# Patient Record
Sex: Male | Born: 1950 | Race: White | Hispanic: No | Marital: Married | Smoking: Never smoker
Health system: Southern US, Community
[De-identification: ages and names within clinical notes are randomized; demographics above are authoritative.]

## PROBLEM LIST (undated history)

## (undated) DIAGNOSIS — E785 Hyperlipidemia, unspecified: Secondary | ICD-10-CM

## (undated) DIAGNOSIS — I10 Essential (primary) hypertension: Secondary | ICD-10-CM

---

## 1998-05-27 ENCOUNTER — Emergency Department (HOSPITAL_COMMUNITY): Admission: EM | Admit: 1998-05-27 | Discharge: 1998-05-28 | Payer: Self-pay | Admitting: Emergency Medicine

## 1999-04-30 ENCOUNTER — Emergency Department (HOSPITAL_COMMUNITY): Admission: EM | Admit: 1999-04-30 | Discharge: 1999-04-30 | Payer: Self-pay | Admitting: Emergency Medicine

## 1999-04-30 ENCOUNTER — Encounter: Payer: Self-pay | Admitting: Emergency Medicine

## 1999-07-14 ENCOUNTER — Encounter: Payer: Self-pay | Admitting: Orthopedic Surgery

## 1999-07-14 ENCOUNTER — Ambulatory Visit (HOSPITAL_COMMUNITY): Admission: RE | Admit: 1999-07-14 | Discharge: 1999-07-14 | Payer: Self-pay | Admitting: Orthopedic Surgery

## 1999-08-15 ENCOUNTER — Ambulatory Visit (HOSPITAL_BASED_OUTPATIENT_CLINIC_OR_DEPARTMENT_OTHER): Admission: RE | Admit: 1999-08-15 | Discharge: 1999-08-16 | Payer: Self-pay | Admitting: Orthopedic Surgery

## 1999-12-29 ENCOUNTER — Encounter: Admission: RE | Admit: 1999-12-29 | Discharge: 1999-12-29 | Payer: Self-pay | Admitting: Gastroenterology

## 1999-12-29 ENCOUNTER — Encounter: Payer: Self-pay | Admitting: Gastroenterology

## 2014-05-01 ENCOUNTER — Emergency Department (HOSPITAL_COMMUNITY): Payer: Commercial Managed Care - HMO

## 2014-05-01 ENCOUNTER — Emergency Department (HOSPITAL_COMMUNITY)
Admission: EM | Admit: 2014-05-01 | Discharge: 2014-05-01 | Disposition: A | Discharge status: Home / Self Care | Payer: Commercial Managed Care - HMO | Attending: Emergency Medicine | Admitting: Emergency Medicine

## 2014-05-01 ENCOUNTER — Encounter (HOSPITAL_COMMUNITY): Payer: Self-pay | Admitting: Emergency Medicine

## 2014-05-01 DIAGNOSIS — M1990 Unspecified osteoarthritis, unspecified site: Secondary | ICD-10-CM

## 2014-05-01 DIAGNOSIS — M25531 Pain in right wrist: Secondary | ICD-10-CM

## 2014-05-01 DIAGNOSIS — M13831 Other specified arthritis, right wrist: Secondary | ICD-10-CM | POA: Insufficient documentation

## 2014-05-01 DIAGNOSIS — I10 Essential (primary) hypertension: Secondary | ICD-10-CM | POA: Diagnosis not present

## 2014-05-01 DIAGNOSIS — Z7982 Long term (current) use of aspirin: Secondary | ICD-10-CM | POA: Diagnosis not present

## 2014-05-01 DIAGNOSIS — E785 Hyperlipidemia, unspecified: Secondary | ICD-10-CM | POA: Insufficient documentation

## 2014-05-01 DIAGNOSIS — R52 Pain, unspecified: Secondary | ICD-10-CM

## 2014-05-01 DIAGNOSIS — Z79899 Other long term (current) drug therapy: Secondary | ICD-10-CM | POA: Insufficient documentation

## 2014-05-01 DIAGNOSIS — M19031 Primary osteoarthritis, right wrist: Secondary | ICD-10-CM | POA: Diagnosis not present

## 2014-05-01 HISTORY — DX: Hyperlipidemia, unspecified: E78.5

## 2014-05-01 HISTORY — DX: Essential (primary) hypertension: I10

## 2014-05-01 MED ORDER — IBUPROFEN 400 MG PO TABS: 400.0000 mg | ORAL_TABLET | Freq: Three times a day (TID) | ORAL | Status: AC | PRN

## 2014-05-01 NOTE — ED Notes (Signed)
Pt ambulatory to exam room with steady gait.  

## 2014-05-01 NOTE — Discharge Instructions (Signed)
Please call your doctor for a followup appointment within 24-48 hours. When you talk to your doctor please let them know that you were seen in the emergency department and have them acquire all of your records so that they can discuss the findings with you and formulate a treatment plan to fully care for your new and ongoing problems. Please call and set-up an appointment with your primary care provider to be seen and assessed Please be seen by hand specialist this week Please keep wrist in brace at all times Please rest, ice, elevate Please take medications as prescribed and on a full stomach  Please continue to monitor symptoms closely and if symptoms are to worsen or change (fever greater than 101, chills, sweating, nausea, vomiting, chest pain, shortness of breathe, difficulty breathing, weakness, numbness, tingling, worsening or changes to pain pattern, fall, injury, increased swelling, red streaks running down arm) please report back to the Emergency Department immediately.   Arthritis, Nonspecific Arthritis is inflammation of a joint. This usually means pain, redness, warmth or swelling are present. One or more joints may be involved. There are a number of types of arthritis. Your caregiver may not be able to tell what type of arthritis you have right away. CAUSES  The most common cause of arthritis is the wear and tear on the joint (osteoarthritis). This causes damage to the cartilage, which can break down over time. The knees, hips, back and neck are most often affected by this type of arthritis. Other types of arthritis and common causes of joint pain include:  Sprains and other injuries near the joint. Sometimes minor sprains and injuries cause pain and swelling that develop hours later.  Rheumatoid arthritis. This affects hands, feet and knees. It usually affects both sides of your body at the same time. It is often associated with chronic ailments, fever, weight loss and general  weakness.  Crystal arthritis. Gout and pseudo gout can cause occasional acute severe pain, redness and swelling in the foot, ankle, or knee.  Infectious arthritis. Bacteria can get into a joint through a break in overlying skin. This can cause infection of the joint. Bacteria and viruses can also spread through the blood and affect your joints.  Drug, infectious and allergy reactions. Sometimes joints can become mildly painful and slightly swollen with these types of illnesses. SYMPTOMS   Pain is the main symptom.  Your joint or joints can also be red, swollen and warm or hot to the touch.  You may have a fever with certain types of arthritis, or even feel overall ill.  The joint with arthritis will hurt with movement. Stiffness is present with some types of arthritis. DIAGNOSIS  Your caregiver will suspect arthritis based on your description of your symptoms and on your exam. Testing may be needed to find the type of arthritis:  Blood and sometimes urine tests.  X-ray tests and sometimes CT or MRI scans.  Removal of fluid from the joint (arthrocentesis) is done to check for bacteria, crystals or other causes. Your caregiver (or a specialist) will numb the area over the joint with a local anesthetic, and use a needle to remove joint fluid for examination. This procedure is only minimally uncomfortable.  Even with these tests, your caregiver may not be able to tell what kind of arthritis you have. Consultation with a specialist (rheumatologist) may be helpful. TREATMENT  Your caregiver will discuss with you treatment specific to your type of arthritis. If the specific type cannot be  determined, then the following general recommendations may apply. Treatment of severe joint pain includes:  Rest.  Elevation.  Anti-inflammatory medication (for example, ibuprofen) may be prescribed. Avoiding activities that cause increased pain.  Only take over-the-counter or prescription medicines for  pain and discomfort as recommended by your caregiver.  Cold packs over an inflamed joint may be used for 10 to 15 minutes every hour. Hot packs sometimes feel better, but do not use overnight. Do not use hot packs if you are diabetic without your caregiver's permission.  A cortisone shot into arthritic joints may help reduce pain and swelling.  Any acute arthritis that gets worse over the next 1 to 2 days needs to be looked at to be sure there is no joint infection. Long-term arthritis treatment involves modifying activities and lifestyle to reduce joint stress jarring. This can include weight loss. Also, exercise is needed to nourish the joint cartilage and remove waste. This helps keep the muscles around the joint strong. HOME CARE INSTRUCTIONS   Do not take aspirin to relieve pain if gout is suspected. This elevates uric acid levels.  Only take over-the-counter or prescription medicines for pain, discomfort or fever as directed by your caregiver.  Rest the joint as much as possible.  If your joint is swollen, keep it elevated.  Use crutches if the painful joint is in your leg.  Drinking plenty of fluids may help for certain types of arthritis.  Follow your caregiver's dietary instructions.  Try low-impact exercise such as:  Swimming.  Water aerobics.  Biking.  Walking.  Morning stiffness is often relieved by a warm shower.  Put your joints through regular range-of-motion. SEEK MEDICAL CARE IF:   You do not feel better in 24 hours or are getting worse.  You have side effects to medications, or are not getting better with treatment. SEEK IMMEDIATE MEDICAL CARE IF:   You have a fever.  You develop severe joint pain, swelling or redness.  Many joints are involved and become painful and swollen.  There is severe back pain and/or leg weakness.  You have loss of bowel or bladder control. Document Released: 05/03/2004 Document Revised: 06/18/2011 Document Reviewed:  05/19/2008 [facility] Patient Information 2015 [place], [place]. This information is not intended to replace advice given to you by your health care provider. Make sure you discuss any questions you have with your health care provider.

## 2014-05-01 NOTE — ED Provider Notes (Signed)
CSN: [id number]     Arrival date & time 05/01/14  1420 History   First MD Initiated Contact with Patient 05/01/14 1546     This chart was scribed for non-physician practitioner, Raymon Mutton PA-C working with Lyanne Co, MD by Arlan Organ, ED Scribe. This patient was seen in room WTR7/WTR7 and the patient's care was started at 4:33 PM.   Chief Complaint  Patient presents with  . Wrist Pain   The history is provided by the patient. No language interpreter was used.    HPI Comments: Charles Hodges is a 64 y/o M with PMHx of HTN, HLD, presenting to the ED with right wrist pain that has been ongoing for one week. Patient reported that the pain started when he woke up last Saturday morning and stated that the pain has continued since then. Patient reported the pain is a throbbing pain that is constant to his right wrist. Stated that the pain worsens with motion. Reported that he has noticed swelling and redness to the skin. Stated that he has been taking Tylenol with minimal relief. Reported that he is right hand dominant. Denied fever, chills, drainage, fall, injury, numbness, tingling, weakness, loss of sensation.  He is followed by Dr. Farris Has PCP  Past Medical History  Diagnosis Date  . Hypertension   . Hyperlipidemia    No past surgical history on file. No family history on file. History  Substance Use Topics  . Smoking status: Never Smoker   . Smokeless tobacco: Not on file  . Alcohol Use: No    Review of Systems  Constitutional: Negative for fever and chills.  Musculoskeletal: Positive for joint swelling and arthralgias.  Neurological: Negative for weakness and numbness.      Allergies  Codeine  Home Medications   Prior to Admission medications   Medication Sig Start Date End Date Taking? Authorizing Provider  acetaminophen (TYLENOL) 500 MG tablet Take 1,000 mg by mouth every 4 (four) hours as needed for moderate pain.   Yes Historical Provider, MD   aspirin EC 81 MG tablet Take 81 mg by mouth daily.   Yes Historical Provider, MD  benazepril-hydrochlorthiazide (LOTENSIN HCT) 20-25 MG per tablet Take 1 tablet by mouth daily.   Yes Historical Provider, MD  citalopram (CELEXA) 20 MG tablet Take 20 mg by mouth daily.   Yes Historical Provider, MD  labetalol (NORMODYNE) 200 MG tablet Take 200 mg by mouth daily.   Yes Historical Provider, MD  simvastatin (ZOCOR) 80 MG tablet Take 80 mg by mouth daily.   Yes Historical Provider, MD  ibuprofen (ADVIL,MOTRIN) 400 MG tablet Take 1 tablet (400 mg total) by mouth every 8 (eight) hours as needed. 05/01/14   Tiphany Fayson, PA-C   Triage Vitals: BP 107/59 mmHg  Pulse 64  Temp(Src) 97.8 F (36.6 C) (Oral)  Resp 18  SpO2 100%   Physical Exam  Constitutional: He is oriented to person, place, and time. He appears well-developed and well-nourished.  HENT:  Head: Normocephalic and atraumatic.  Eyes: Conjunctivae and EOM are normal. Pupils are equal, round, and reactive to light. Right eye exhibits no discharge. Left eye exhibits no discharge.  Neck: Normal range of motion. Neck supple.  Cardiovascular: Normal rate, regular rhythm and normal heart sounds.   Pulses:      Radial pulses are 2+ on the right side, and 2+ on the left side.  Cap refill < 3 seconds  Pulmonary/Chest: Effort normal and breath sounds normal. No respiratory  distress. He has no wheezes. He has no rales.  Musculoskeletal:       Right wrist: He exhibits decreased range of motion (secondary to pain ), tenderness and swelling. He exhibits no bony tenderness, no effusion, no crepitus and no deformity.       Arms: Swelling and erythema identified to the right wrist with warmth upon palpation. Patient is able to flex, extend, radial deviation, and ulnar deviation but limited secondary to pain. Patient able to produce a fist, but painful to do so. Full ROM to the digits of the right hand without difficulty or ataxia.   Neurological: He is  alert and oriented to person, place, and time. No cranial nerve deficit. He exhibits normal muscle tone. Coordination normal.  Cranial nerves III-XII grossly intact Strength 5+/5+ to upper extremities bilaterally with resistance applied, equal distribution noted Strength intact to MCP, PIP, DIP joints of right hand Sensation intact with differentiation to sharp and dull touch   Skin: There is erythema.  Please see MS section   Psychiatric: He has a normal mood and affect. His behavior is normal. Thought content normal.  Nursing note and vitals reviewed.   ED Course  Procedures (including critical care time)  DIAGNOSTIC STUDIES: Oxygen Saturation is 100% on RA, Normal by my interpretation.    COORDINATION OF CARE: 4:33 PM- Will order DG wrist complete R. Discussed treatment plan with pt at bedside and pt agreed to plan.     Labs Review Labs Reviewed - No data to display  Imaging Review Dg Wrist Complete Right  05/01/2014   CLINICAL DATA:  Pain and swelling right wrist for 1 week, no known injury  EXAM: RIGHT WRIST - COMPLETE 3+ VIEW  COMPARISON:  None.  FINDINGS: Four views of the right wrist submitted. No acute fracture or subluxation. There is narrowing of radiocarpal joint space. Chondrocalcinosis noted ulnar carpal joint.  IMPRESSION: No acute fracture or subluxation. Osteoarthritic changes and chondrocalcinosis as described above.   Electronically Signed   By: Natasha MeadLiviu  Pop M.D.   On: 05/01/2014 15:09     EKG Interpretation None       5:19 PM Discussed case in great detail with attending physician, Dr. Patria Maneampos. Dr. Patria Maneampos at bedside to assess patient.  As per physician, recommended patient to be placed in wrist brace, recommended anti-inflammatories and for patient to be seen by PCP.   MDM   Final diagnoses:  Right wrist pain  Arthritis    Medications - No data to display   Filed Vitals:   05/01/14 1441  BP: 107/59  Pulse: 64  Temp: 97.8 F (36.6 C)  TempSrc: Oral   Resp: 18  SpO2: 100%   I personally performed the services described in this documentation, which was scribed in my presence. The recorded information has been reviewed and is accurate.  Plain film of right wrist identified negative acute osseous injury - osteoarthritis changes and chondrocalcinosis noted.  Doubt septic joint. Doubt gout/pseudogout. Cannot rule out reactive arthritis. Negative focal neurological deficits. ROM noted to the right wrist - limited secondary to pain. Sensation intact. Pulses palpable and strong. Cap refill < 3 seconds. Patient seen and assessed by attending physician - recommended wrist brace and anti-inflammatories, recommended referral to PCP. Patient stable, afebrile. Patient not septic appearing. Discharged patient. Discussed with patient to rest and stay hydrated. Discussed with patient to avoid any physical or strenuous activity. Referred to PCP and hand specialist. Discussed with patient to closely monitor symptoms and if symptoms  are to worsen or change to report back to the ED - strict return instructions given.  Patient agreed to plan of care, understood, all questions answered.   Raymon Mutton, PA-C 05/01/14 1736  Lyanne Co, MD 05/01/14 [phone]

## 2014-05-01 NOTE — ED Notes (Signed)
Ortho tech at bedside 

## 2014-05-01 NOTE — ED Notes (Signed)
Per pt, states right wrist pain since last Sun-no injury

## 2014-05-25 DIAGNOSIS — M25531 Pain in right wrist: Secondary | ICD-10-CM | POA: Diagnosis not present

## 2014-05-25 DIAGNOSIS — M25431 Effusion, right wrist: Secondary | ICD-10-CM | POA: Diagnosis not present

## 2014-05-25 DIAGNOSIS — M25631 Stiffness of right wrist, not elsewhere classified: Secondary | ICD-10-CM | POA: Diagnosis not present

## 2014-05-25 DIAGNOSIS — M25461 Effusion, right knee: Secondary | ICD-10-CM | POA: Diagnosis not present

## 2014-05-25 DIAGNOSIS — M11831 Other specified crystal arthropathies, right wrist: Secondary | ICD-10-CM | POA: Diagnosis not present

## 2014-06-01 DIAGNOSIS — M10031 Idiopathic gout, right wrist: Secondary | ICD-10-CM | POA: Diagnosis not present

## 2014-06-01 DIAGNOSIS — M25531 Pain in right wrist: Secondary | ICD-10-CM | POA: Diagnosis not present

## 2014-06-01 DIAGNOSIS — M11831 Other specified crystal arthropathies, right wrist: Secondary | ICD-10-CM | POA: Diagnosis not present

## 2014-06-11 DIAGNOSIS — N289 Disorder of kidney and ureter, unspecified: Secondary | ICD-10-CM | POA: Diagnosis not present

## 2014-06-11 DIAGNOSIS — I10 Essential (primary) hypertension: Secondary | ICD-10-CM | POA: Diagnosis not present

## 2014-06-11 DIAGNOSIS — H6122 Impacted cerumen, left ear: Secondary | ICD-10-CM | POA: Diagnosis not present

## 2014-06-11 DIAGNOSIS — M109 Gout, unspecified: Secondary | ICD-10-CM | POA: Diagnosis not present

## 2014-06-11 DIAGNOSIS — D649 Anemia, unspecified: Secondary | ICD-10-CM | POA: Diagnosis not present

## 2014-08-05 DIAGNOSIS — M109 Gout, unspecified: Secondary | ICD-10-CM | POA: Diagnosis not present

## 2014-12-02 DIAGNOSIS — M109 Gout, unspecified: Secondary | ICD-10-CM | POA: Diagnosis not present

## 2014-12-02 DIAGNOSIS — N289 Disorder of kidney and ureter, unspecified: Secondary | ICD-10-CM | POA: Diagnosis not present

## 2014-12-02 DIAGNOSIS — D649 Anemia, unspecified: Secondary | ICD-10-CM | POA: Diagnosis not present

## 2014-12-08 DIAGNOSIS — E538 Deficiency of other specified B group vitamins: Secondary | ICD-10-CM | POA: Diagnosis not present

## 2014-12-09 DIAGNOSIS — E538 Deficiency of other specified B group vitamins: Secondary | ICD-10-CM | POA: Diagnosis not present

## 2014-12-10 DIAGNOSIS — E538 Deficiency of other specified B group vitamins: Secondary | ICD-10-CM | POA: Diagnosis not present

## 2014-12-17 DIAGNOSIS — E538 Deficiency of other specified B group vitamins: Secondary | ICD-10-CM | POA: Diagnosis not present

## 2014-12-20 DIAGNOSIS — E538 Deficiency of other specified B group vitamins: Secondary | ICD-10-CM | POA: Diagnosis not present

## 2014-12-24 DIAGNOSIS — E538 Deficiency of other specified B group vitamins: Secondary | ICD-10-CM | POA: Diagnosis not present

## 2015-01-05 DIAGNOSIS — E538 Deficiency of other specified B group vitamins: Secondary | ICD-10-CM | POA: Diagnosis not present

## 2015-01-05 DIAGNOSIS — D649 Anemia, unspecified: Secondary | ICD-10-CM | POA: Diagnosis not present

## 2015-01-14 DIAGNOSIS — E538 Deficiency of other specified B group vitamins: Secondary | ICD-10-CM | POA: Diagnosis not present

## 2015-02-18 DIAGNOSIS — F329 Major depressive disorder, single episode, unspecified: Secondary | ICD-10-CM | POA: Diagnosis not present

## 2015-02-18 DIAGNOSIS — Z23 Encounter for immunization: Secondary | ICD-10-CM | POA: Diagnosis not present

## 2015-02-18 DIAGNOSIS — D649 Anemia, unspecified: Secondary | ICD-10-CM | POA: Diagnosis not present

## 2015-02-18 DIAGNOSIS — N183 Chronic kidney disease, stage 3 (moderate): Secondary | ICD-10-CM | POA: Diagnosis not present

## 2015-02-18 DIAGNOSIS — E785 Hyperlipidemia, unspecified: Secondary | ICD-10-CM | POA: Diagnosis not present

## 2015-02-18 DIAGNOSIS — Z0001 Encounter for general adult medical examination with abnormal findings: Secondary | ICD-10-CM | POA: Diagnosis not present

## 2015-02-18 DIAGNOSIS — R7301 Impaired fasting glucose: Secondary | ICD-10-CM | POA: Diagnosis not present

## 2015-02-18 DIAGNOSIS — M109 Gout, unspecified: Secondary | ICD-10-CM | POA: Diagnosis not present

## 2015-02-22 DIAGNOSIS — E785 Hyperlipidemia, unspecified: Secondary | ICD-10-CM | POA: Diagnosis not present

## 2015-02-22 DIAGNOSIS — M109 Gout, unspecified: Secondary | ICD-10-CM | POA: Diagnosis not present

## 2015-02-22 DIAGNOSIS — E611 Iron deficiency: Secondary | ICD-10-CM | POA: Diagnosis not present

## 2015-02-22 DIAGNOSIS — Z0001 Encounter for general adult medical examination with abnormal findings: Secondary | ICD-10-CM | POA: Diagnosis not present

## 2015-02-22 DIAGNOSIS — D649 Anemia, unspecified: Secondary | ICD-10-CM | POA: Diagnosis not present

## 2015-02-22 DIAGNOSIS — E538 Deficiency of other specified B group vitamins: Secondary | ICD-10-CM | POA: Diagnosis not present

## 2015-02-22 DIAGNOSIS — Z125 Encounter for screening for malignant neoplasm of prostate: Secondary | ICD-10-CM | POA: Diagnosis not present

## 2015-02-24 DIAGNOSIS — Z1211 Encounter for screening for malignant neoplasm of colon: Secondary | ICD-10-CM | POA: Diagnosis not present

## 2016-06-20 DIAGNOSIS — I10 Essential (primary) hypertension: Secondary | ICD-10-CM | POA: Diagnosis not present

## 2016-06-20 DIAGNOSIS — Z1211 Encounter for screening for malignant neoplasm of colon: Secondary | ICD-10-CM | POA: Diagnosis not present

## 2016-06-20 DIAGNOSIS — R7309 Other abnormal glucose: Secondary | ICD-10-CM | POA: Diagnosis not present

## 2016-06-20 DIAGNOSIS — F329 Major depressive disorder, single episode, unspecified: Secondary | ICD-10-CM | POA: Diagnosis not present

## 2016-06-20 DIAGNOSIS — E785 Hyperlipidemia, unspecified: Secondary | ICD-10-CM | POA: Diagnosis not present

## 2017-06-18 DIAGNOSIS — F329 Major depressive disorder, single episode, unspecified: Secondary | ICD-10-CM | POA: Diagnosis not present

## 2017-06-18 DIAGNOSIS — D649 Anemia, unspecified: Secondary | ICD-10-CM | POA: Diagnosis not present

## 2017-06-18 DIAGNOSIS — N289 Disorder of kidney and ureter, unspecified: Secondary | ICD-10-CM | POA: Diagnosis not present

## 2017-06-18 DIAGNOSIS — E785 Hyperlipidemia, unspecified: Secondary | ICD-10-CM | POA: Diagnosis not present

## 2017-06-18 DIAGNOSIS — R7301 Impaired fasting glucose: Secondary | ICD-10-CM | POA: Diagnosis not present

## 2017-06-18 DIAGNOSIS — I10 Essential (primary) hypertension: Secondary | ICD-10-CM | POA: Diagnosis not present

## 2017-06-18 DIAGNOSIS — Z1211 Encounter for screening for malignant neoplasm of colon: Secondary | ICD-10-CM | POA: Diagnosis not present

## 2018-09-25 DIAGNOSIS — Z1211 Encounter for screening for malignant neoplasm of colon: Secondary | ICD-10-CM | POA: Diagnosis not present

## 2018-09-25 DIAGNOSIS — D649 Anemia, unspecified: Secondary | ICD-10-CM | POA: Diagnosis not present

## 2018-09-25 DIAGNOSIS — N289 Disorder of kidney and ureter, unspecified: Secondary | ICD-10-CM | POA: Diagnosis not present

## 2018-09-25 DIAGNOSIS — I10 Essential (primary) hypertension: Secondary | ICD-10-CM | POA: Diagnosis not present

## 2018-09-25 DIAGNOSIS — E785 Hyperlipidemia, unspecified: Secondary | ICD-10-CM | POA: Diagnosis not present

## 2018-09-25 DIAGNOSIS — R7301 Impaired fasting glucose: Secondary | ICD-10-CM | POA: Diagnosis not present

## 2018-09-25 DIAGNOSIS — F329 Major depressive disorder, single episode, unspecified: Secondary | ICD-10-CM | POA: Diagnosis not present

## 2018-10-13 DIAGNOSIS — Z1211 Encounter for screening for malignant neoplasm of colon: Secondary | ICD-10-CM | POA: Diagnosis not present

## 2018-10-13 DIAGNOSIS — D649 Anemia, unspecified: Secondary | ICD-10-CM | POA: Diagnosis not present

## 2018-10-13 DIAGNOSIS — R7301 Impaired fasting glucose: Secondary | ICD-10-CM | POA: Diagnosis not present

## 2018-10-13 DIAGNOSIS — E785 Hyperlipidemia, unspecified: Secondary | ICD-10-CM | POA: Diagnosis not present

## 2020-06-29 DIAGNOSIS — R195 Other fecal abnormalities: Secondary | ICD-10-CM | POA: Diagnosis not present

## 2020-06-29 DIAGNOSIS — I10 Essential (primary) hypertension: Secondary | ICD-10-CM | POA: Diagnosis not present

## 2020-06-29 DIAGNOSIS — E538 Deficiency of other specified B group vitamins: Secondary | ICD-10-CM | POA: Diagnosis not present

## 2020-06-29 DIAGNOSIS — E785 Hyperlipidemia, unspecified: Secondary | ICD-10-CM | POA: Diagnosis not present

## 2020-06-29 DIAGNOSIS — R7301 Impaired fasting glucose: Secondary | ICD-10-CM | POA: Diagnosis not present

## 2020-06-29 DIAGNOSIS — R7309 Other abnormal glucose: Secondary | ICD-10-CM | POA: Diagnosis not present

## 2020-06-29 DIAGNOSIS — F329 Major depressive disorder, single episode, unspecified: Secondary | ICD-10-CM | POA: Diagnosis not present

## 2020-06-29 DIAGNOSIS — N183 Chronic kidney disease, stage 3 unspecified: Secondary | ICD-10-CM | POA: Diagnosis not present

## 2020-06-29 DIAGNOSIS — D649 Anemia, unspecified: Secondary | ICD-10-CM | POA: Diagnosis not present

## 2020-09-02 DIAGNOSIS — E538 Deficiency of other specified B group vitamins: Secondary | ICD-10-CM | POA: Diagnosis not present

## 2020-10-07 DIAGNOSIS — R42 Dizziness and giddiness: Secondary | ICD-10-CM | POA: Diagnosis not present

## 2020-10-07 DIAGNOSIS — H6122 Impacted cerumen, left ear: Secondary | ICD-10-CM | POA: Diagnosis not present

## 2020-10-31 ENCOUNTER — Emergency Department (HOSPITAL_COMMUNITY): Payer: Medicare HMO

## 2020-10-31 ENCOUNTER — Emergency Department (HOSPITAL_COMMUNITY)
Admission: EM | Admit: 2020-10-31 | Discharge: 2020-10-31 | Disposition: A | Discharge status: Home / Self Care | Payer: Medicare HMO | Attending: Emergency Medicine | Admitting: Emergency Medicine

## 2020-10-31 ENCOUNTER — Encounter (HOSPITAL_COMMUNITY): Payer: Self-pay | Admitting: Emergency Medicine

## 2020-10-31 DIAGNOSIS — R011 Cardiac murmur, unspecified: Secondary | ICD-10-CM | POA: Insufficient documentation

## 2020-10-31 DIAGNOSIS — R42 Dizziness and giddiness: Secondary | ICD-10-CM | POA: Diagnosis not present

## 2020-10-31 DIAGNOSIS — R61 Generalized hyperhidrosis: Secondary | ICD-10-CM | POA: Diagnosis not present

## 2020-10-31 DIAGNOSIS — Y900 Blood alcohol level of less than 20 mg/100 ml: Secondary | ICD-10-CM | POA: Diagnosis not present

## 2020-10-31 DIAGNOSIS — Z7982 Long term (current) use of aspirin: Secondary | ICD-10-CM | POA: Diagnosis not present

## 2020-10-31 DIAGNOSIS — I639 Cerebral infarction, unspecified: Secondary | ICD-10-CM | POA: Diagnosis not present

## 2020-10-31 DIAGNOSIS — Z79899 Other long term (current) drug therapy: Secondary | ICD-10-CM | POA: Diagnosis not present

## 2020-10-31 DIAGNOSIS — I10 Essential (primary) hypertension: Secondary | ICD-10-CM | POA: Diagnosis not present

## 2020-10-31 DIAGNOSIS — R110 Nausea: Secondary | ICD-10-CM | POA: Diagnosis not present

## 2020-10-31 DIAGNOSIS — E860 Dehydration: Secondary | ICD-10-CM | POA: Diagnosis not present

## 2020-10-31 DIAGNOSIS — I4430 Unspecified atrioventricular block: Secondary | ICD-10-CM | POA: Diagnosis not present

## 2020-10-31 DIAGNOSIS — M542 Cervicalgia: Secondary | ICD-10-CM | POA: Insufficient documentation

## 2020-10-31 DIAGNOSIS — I440 Atrioventricular block, first degree: Secondary | ICD-10-CM | POA: Diagnosis not present

## 2020-10-31 DIAGNOSIS — R29810 Facial weakness: Secondary | ICD-10-CM | POA: Diagnosis not present

## 2020-10-31 LAB — DIFFERENTIAL
Abs Immature Granulocytes: 0.08 10*3/uL — ABNORMAL HIGH (ref 0.00–0.07)
Basophils Absolute: 0 10*3/uL (ref 0.0–0.1)
Basophils Relative: 0 %
Eosinophils Absolute: 0.1 10*3/uL (ref 0.0–0.5)
Eosinophils Relative: 1 %
Immature Granulocytes: 1 %
Lymphocytes Relative: 8 %
Lymphs Abs: 0.8 10*3/uL (ref 0.7–4.0)
Monocytes Absolute: 0.8 10*3/uL (ref 0.1–1.0)
Monocytes Relative: 8 %
Neutro Abs: 8 10*3/uL — ABNORMAL HIGH (ref 1.7–7.7)
Neutrophils Relative %: 82 %

## 2020-10-31 LAB — COMPREHENSIVE METABOLIC PANEL
ALT: 22 U/L (ref 0–44)
AST: 26 U/L (ref 15–41)
Albumin: 3.8 g/dL (ref 3.5–5.0)
Alkaline Phosphatase: 49 U/L (ref 38–126)
Anion gap: 6 (ref 5–15)
BUN: 24 mg/dL — ABNORMAL HIGH (ref 8–23)
CO2: 23 mmol/L (ref 22–32)
Calcium: 9 mg/dL (ref 8.9–10.3)
Chloride: 108 mmol/L (ref 98–111)
Creatinine, Ser: 1.44 mg/dL — ABNORMAL HIGH (ref 0.61–1.24)
GFR, Estimated: 52 mL/min — ABNORMAL LOW (ref >60)
Glucose, Bld: 178 mg/dL — ABNORMAL HIGH (ref 70–99)
Potassium: 3.9 mmol/L (ref 3.5–5.1)
Sodium: 137 mmol/L (ref 135–145)
Total Bilirubin: 0.8 mg/dL (ref 0.3–1.2)
Total Protein: 7 g/dL (ref 6.5–8.1)

## 2020-10-31 LAB — PROTIME-INR
INR: 1 (ref 0.8–1.2)
Prothrombin Time: 13.6 seconds (ref 11.4–15.2)

## 2020-10-31 LAB — URINALYSIS, ROUTINE W REFLEX MICROSCOPIC
Bilirubin Urine: NEGATIVE
Glucose, UA: 50 mg/dL — AB
Ketones, ur: NEGATIVE mg/dL
Nitrite: NEGATIVE
Protein, ur: 30 mg/dL — AB
Specific Gravity, Urine: 1.014 (ref 1.005–1.030)
WBC, UA: >50 WBC/hpf — ABNORMAL HIGH (ref 0–5)
pH: 7 (ref 5.0–8.0)

## 2020-10-31 LAB — CBC
HCT: 40.3 % (ref 39.0–52.0)
Hemoglobin: 13.7 g/dL (ref 13.0–17.0)
MCH: 32 pg (ref 26.0–34.0)
MCHC: 34 g/dL (ref 30.0–36.0)
MCV: 94.2 fL (ref 80.0–100.0)
Platelets: 187 10*3/uL (ref 150–400)
RBC: 4.28 MIL/uL (ref 4.22–5.81)
RDW: 12.3 % (ref 11.5–15.5)
WBC: 9.9 10*3/uL (ref 4.0–10.5)
nRBC: 0 % (ref 0.0–0.2)

## 2020-10-31 LAB — ETHANOL: Alcohol, Ethyl (B): <10 mg/dL (ref <10)

## 2020-10-31 LAB — I-STAT CHEM 8, ED
BUN: 29 mg/dL — ABNORMAL HIGH (ref 8–23)
Calcium, Ion: 1.13 mmol/L — ABNORMAL LOW (ref 1.15–1.40)
Chloride: 108 mmol/L (ref 98–111)
Creatinine, Ser: 1.3 mg/dL — ABNORMAL HIGH (ref 0.61–1.24)
Glucose, Bld: 173 mg/dL — ABNORMAL HIGH (ref 70–99)
HCT: 40 % (ref 39.0–52.0)
Hemoglobin: 13.6 g/dL (ref 13.0–17.0)
Potassium: 4.1 mmol/L (ref 3.5–5.1)
Sodium: 140 mmol/L (ref 135–145)
TCO2: 23 mmol/L (ref 22–32)

## 2020-10-31 LAB — APTT: aPTT: 25 seconds (ref 24–36)

## 2020-10-31 LAB — RAPID URINE DRUG SCREEN, HOSP PERFORMED
Amphetamines: NOT DETECTED
Barbiturates: NOT DETECTED
Benzodiazepines: NOT DETECTED
Cocaine: NOT DETECTED
Opiates: NOT DETECTED
Tetrahydrocannabinol: NOT DETECTED

## 2020-10-31 MED ORDER — SODIUM CHLORIDE 0.9 % IV SOLN
1.0000 g | Freq: Once | INTRAVENOUS | Status: AC
  Administered 2020-10-31: 1 g via INTRAVENOUS
  Filled 2020-10-31: qty 10

## 2020-10-31 MED ORDER — ONDANSETRON 4 MG PO TBDP
4.0000 mg | ORAL_TABLET | Freq: Once | ORAL | Status: AC
  Administered 2020-10-31: 4 mg via ORAL
  Filled 2020-10-31: qty 1

## 2020-10-31 MED ORDER — CEPHALEXIN 500 MG PO CAPS: 500.0000 mg | ORAL_CAPSULE | Freq: Three times a day (TID) | ORAL | 0 refills | Status: AC

## 2020-10-31 MED ORDER — LABETALOL HCL 5 MG/ML IV SOLN
10.0000 mg | Freq: Once | INTRAVENOUS | Status: AC
  Administered 2020-10-31: 10 mg via INTRAVENOUS
  Filled 2020-10-31: qty 4

## 2020-10-31 MED ORDER — MECLIZINE HCL 25 MG PO TABS
25.0000 mg | ORAL_TABLET | Freq: Once | ORAL | Status: AC
  Administered 2020-10-31: 25 mg via ORAL
  Filled 2020-10-31: qty 1

## 2020-10-31 MED ORDER — MECLIZINE HCL 25 MG PO TABS: 25.0000 mg | ORAL_TABLET | Freq: Three times a day (TID) | ORAL | 0 refills | Status: AC | PRN

## 2020-10-31 NOTE — Discharge Instructions (Addendum)
You were evaluated in the ER for your dizziness.  Our work-up did not reveal any significantly concerning disease.  This includes negative imaging and reassuring lab work.  Your blood pressure improved after we treated with labetalol.  You should be sure to follow-up with your primary care doctor to discuss increasing your blood pressure medication again to better control this.  Additionally, you were diagnosed with a UTI, for which you should take Keflex 3 times a day every day for the next 5 days, even if symptoms appear to start improving.  Please return to the ER for any worsening, including fevers, chills, fainting episodes, worsening dizziness, severe headaches, severe chest pain

## 2020-10-31 NOTE — ED Provider Notes (Signed)
Emergency Medicine Provider Triage Evaluation Note  Charles Hodges , a 70 y.o. male  was evaluated in triage.  Pt complains of dizziness.  Symptoms began around 9:00 this morning.  No chest pain or shortness of breath.  No weakness or numbness in the extremities. Sxs worse with standing and eye movement.   Review of Systems  Positive: dizziness Negative: numbness  Physical Exam  BP (!) 183/79 (BP Location: Right Arm)   Pulse 68   Temp 97.7 F (36.5 C) (Oral)   Resp 18   Ht 5\' 11"  (1.803 m)   Wt 72.1 kg   SpO2 97%   BMI 22.18 kg/m  Gen:   Awake, no distress   Resp:  Normal effort  MSK:   Moves extremities without difficulty.  Strength and sensation intact x4.   Other:  CN intact.  Negative pronator drift. EOMI  Medical Decision Making  Medically screening exam initiated at 10:40 AM.  Appropriate orders placed.  was informed that the remainder of the evaluation will be completed by another provider, this initial triage assessment does not replace that evaluation, and the importance of remaining in the ED until their evaluation is complete.  No focal neurologic deficits, so code stroke was not called.  However will obtain stroke work-up due to acute onset dizziness.  Also consider vertigo.   Hansel Feinstein, PA-C 10/31/20 1041    11/02/20, MD 11/01/20 1753

## 2020-10-31 NOTE — ED Triage Notes (Signed)
Pt arrives via EMS with sudden onset dizziness at 9 am while pumping gas. Endorses left sided neck pressure. EMS gave 500 NS, pt has not taken BP meds. Endorses similar event on Thursday night with nausea.

## 2020-10-31 NOTE — ED Provider Notes (Addendum)
[facility]   CSN: [id number] Arrival date & time: 10/31/20  1017     History Chief Complaint  Patient presents with   Dizziness    Charles Hodges is a 70 y.o. male.   Dizziness  70 year old male PMHx HTN, HLD, brought by EMS for dizziness.  States that onset was sudden at 0900, while pumping gas, worse with standing and eye movement.  Associated symptoms include mild left posterior neck pressure, brief diaphoresis that has since resolved.  Did not have antihypertensives today.  Had similar symptoms 4 days ago with nausea.  S/p NS 500 mL with EMS.  No further medical concern at this time including fevers, chills, sore throat, rhinorrhea, CP, palpitations, pedal edema, SOB, focal weakness/paresthesias headache, abdominal pain, bowel changes.  History obtained from patient, wife, chart review.  Past Medical History:  Diagnosis Date   Hyperlipidemia    Hypertension     There are no problems to display for this patient.   History reviewed. No pertinent surgical history.     No family history on file.  Social History   Tobacco Use   Smoking status: Never  Substance Use Topics   Alcohol use: No   Drug use: No    Home Medications Prior to Admission medications   Medication Sig Start Date End Date Taking? Authorizing Provider  cephALEXin (KEFLEX) 500 MG capsule Take 1 capsule (500 mg total) by mouth 3 (three) times daily for 5 days. 10/31/20 11/05/20 Yes Cleatis Fandrich, Harriette BouillonAdditya, MD  meclizine (ANTIVERT) 25 MG tablet Take 1 tablet (25 mg total) by mouth 3 (three) times daily as needed for dizziness. 10/31/20  Yes Luka Reisch, MD  acetaminophen (TYLENOL) 500 MG tablet Take 1,000 mg by mouth every 4 (four) hours as needed for moderate pain.    [provider]  aspirin EC 81 MG tablet Take 81 mg by mouth daily.    [provider]  benazepril-hydrochlorthiazide (LOTENSIN HCT) 20-25 MG per tablet  Take 1 tablet by mouth daily.    [provider]  citalopram (CELEXA) 20 MG tablet Take 20 mg by mouth daily.    [provider]  ibuprofen (ADVIL,MOTRIN) 400 MG tablet Take 1 tablet (400 mg total) by mouth every 8 (eight) hours as needed. 05/01/14   Sciacca, Marissa, PA-C  labetalol (NORMODYNE) 200 MG tablet Take 200 mg by mouth daily.    [provider]  simvastatin (ZOCOR) 80 MG tablet Take 80 mg by mouth daily.    [provider]    Allergies    Codeine  Review of Systems   Review of Systems  Neurological:  Positive for dizziness.  All other systems reviewed and are negative.  Physical Exam Updated Vital Signs BP (!) 152/95   Pulse 82   Temp 98.7 F (37.1 C) (Oral)   Resp 14   Ht 5\' 11"  (1.803 m)   Wt 72.1 kg   SpO2 96%   BMI 22.18 kg/m   Physical Exam Vitals and nursing note reviewed.  Constitutional:      General: He is not in acute distress. HENT:     Head: Normocephalic and atraumatic.     Mouth/Throat:     Mouth: Mucous membranes are moist.     Pharynx: Oropharynx is clear.  Eyes:     Extraocular Movements: Extraocular movements intact.     Conjunctiva/sclera: Conjunctivae normal.     Pupils: Pupils are equal, round, and reactive to light.  Comments: Left-sided cataract  Cardiovascular:     Rate and Rhythm: Normal rate and regular rhythm.     Heart sounds: Murmur heard.    No friction rub. No gallop.  Pulmonary:     Effort: Pulmonary effort is normal.     Breath sounds: No stridor. No wheezing, rhonchi or rales.  Abdominal:     General: There is no distension.     Palpations: Abdomen is soft.     Tenderness: There is no abdominal tenderness. There is no guarding or rebound.  Musculoskeletal:     Cervical back: Normal range of motion. No rigidity.     Right lower leg: No edema.     Left lower leg: No edema.  Skin:    General: Skin is warm and dry.  Neurological:     Mental Status: He is alert and oriented to  person, place, and time. Mental status is at baseline.     Comments: Mental status: a&o x4 Speech: clear, no dysarthria CN II: visual fields grossly intact CN III/IV/VI: PERRL, EOMI CN V: facial sensation to LT and mastication intact CN VII: no facial droop CN VIII: no nystagmus, audition intact to finger rub VN IX/X: swallow intact CN XI: trapezius and SCM motor function intact CN XII: midline tongue w/o atrophy or fasciculation RUE: 5/5 strength, sensation to LT intact LUE: 5/5 strength, sensation to LT intact RLE: 5/5 strength, sensation to LT intact LLE: 5/5 strength, sensation to LT intact Coordination: finger-to-nose. No dysdiadochokinesia Gait: Untested   Psychiatric:        Mood and Affect: Mood normal.        Behavior: Behavior normal.    ED Results / Procedures / Treatments   Labs (all labs ordered are listed, but only abnormal results are displayed) Labs Reviewed  DIFFERENTIAL - Abnormal; Notable for the following components:      Result Value   Neutro Abs 8.0 (*)    Abs Immature Granulocytes 0.08 (*)    All other components within normal limits  COMPREHENSIVE METABOLIC PANEL - Abnormal; Notable for the following components:   Glucose, Bld 178 (*)    BUN 24 (*)    Creatinine, Ser 1.44 (*)    GFR, Estimated 52 (*)    All other components within normal limits  URINALYSIS, ROUTINE W REFLEX MICROSCOPIC - Abnormal; Notable for the following components:   APPearance CLOUDY (*)    Glucose, UA 50 (*)    Hgb urine dipstick MODERATE (*)    Protein, ur 30 (*)    Leukocytes,Ua LARGE (*)    WBC, UA >50 (*)    Bacteria, UA MANY (*)    All other components within normal limits  I-STAT CHEM 8, ED - Abnormal; Notable for the following components:   BUN 29 (*)    Creatinine, Ser 1.30 (*)    Glucose, Bld 173 (*)    Calcium, Ion 1.13 (*)    All other components within normal limits  URINE CULTURE  ETHANOL  PROTIME-INR  APTT  CBC  RAPID URINE DRUG SCREEN, HOSP  PERFORMED    EKG EKG Interpretation  Date/Time:  Monday October 31 2020 10:28:36 EDT Ventricular Rate:  69 PR Interval:  192 QRS Duration: 86 QT Interval:  404 QTC Calculation: 432 R Axis:   10 Text Interpretation: Normal sinus rhythm Normal ECG NSR, similar to previous Confirmed by Coralee Pesa [phone]) on 10/31/2020 3:40:58 PM  Radiology CT HEAD WO CONTRAST  Result Date: 10/31/2020 CLINICAL DATA:  Dizziness EXAM: CT HEAD WITHOUT CONTRAST TECHNIQUE: Contiguous axial images were obtained from the base of the skull through the vertex without intravenous contrast. COMPARISON:  None. FINDINGS: Brain: There is atrophy and chronic small vessel disease changes. No acute intracranial abnormality. Specifically, no hemorrhage, hydrocephalus, mass lesion, acute infarction, or significant intracranial injury. Vascular: No hyperdense vessel or unexpected calcification. Skull: No acute calvarial abnormality. Sinuses/Orbits: No acute findings Other: None IMPRESSION: Atrophy, chronic microvascular disease. No acute intracranial abnormality. Electronically Signed   By: Charlett Nose M.D.   On: 10/31/2020 12:07   MR BRAIN WO CONTRAST  Result Date: 10/31/2020 CLINICAL DATA:  Dizziness, nonspecific. EXAM: MRI HEAD WITHOUT CONTRAST TECHNIQUE: Multiplanar, multiecho pulse sequences of the brain and surrounding structures were obtained without intravenous contrast. COMPARISON:  Same day CT head. FINDINGS: Mildly motion limited evaluation.  Within this limitation: Brain: No acute infarction, hemorrhage, hydrocephalus, extra-axial collection or mass lesion. Mild for age scattered T2/FLAIR hyperintensities within the white matter, nonspecific but most likely related to chronic microvascular ischemic disease. Mild to moderate atrophy with ex vacuo ventricular dilation. Vascular: Major arterial flow voids are maintained at the skull base. Skull and upper cervical spine: Normal marrow signal. Sinuses/Orbits: Mild ethmoid air  cell mucosal thickening. Small retention cyst in the inferior left maxillary sinus. Motion limited evaluation orbits without definite acute abnormality. Other: No mastoid effusions. IMPRESSION: 1. No evidence of acute intracranial abnormality. Specifically, no acute infarct. 2. Mild-to-moderate atrophy and mild chronic microvascular ischemic disease. Electronically Signed   By: Feliberto Harts MD   On: 10/31/2020 18:37    Procedures Procedures   Medications Ordered in ED Medications  ondansetron (ZOFRAN-ODT) disintegrating tablet 4 mg (4 mg Oral Given 10/31/20 1048)  meclizine (ANTIVERT) tablet 25 mg (25 mg Oral Given 10/31/20 1048)  labetalol (NORMODYNE) injection 10 mg (10 mg Intravenous Given 10/31/20 1555)  cefTRIAXone (ROCEPHIN) 1 g in sodium chloride 0.9 % 100 mL IVPB (0 g Intravenous Stopped 10/31/20 1620)    ED Course  I have reviewed the triage vital signs and the nursing notes.  Pertinent labs & imaging results that were available during my care of the patient were reviewed by me and considered in my medical decision making (see chart for details).    MDM Rules/Calculators/A&P                         This is a 70 year old male PMHx HTN, HLD, here for sudden onset dizziness this morning.  Notes that he missed his morning BP meds.  Additionally has malodorous urine, with incontinence at baseline requiring depends diaper.  HDS, afebrile, but with systolic in low 200s.  Neuro intact with full strength, lungs CTA bilaterally.  Initial interventions: Labetalol 10 mg IV for blood pressure control.  CTX 1 g IV for presumed UTI  All studies independently reviewed by myself, d/w the attending physician, factored into my MDM. -EKG's: NSR 69 bpm, normal axis, normal intervals, no acute ST/T changes; no prior for comparison -CMP: Glucose 178, CR 1.44, BUN 24 -UA: Pyuria and hematuria, nitrite negative -Unremarkable: CT head noncontrast, MR brain without contrast, EtOH, PT/PTT, CBCd,  UDS -Pending: Urine culture  Presentation most consistent with hypertensive urgency versus emergency as well as UTI.  Reassuring MRI brain, suggesting stroke, ICH, space-occupying lesion, or other concerning CNS pathology.  EtOH and UDS WNL.  EKG without significant arrhythmia or ischemic changes.  No significant electrolyte derangements, patient notes that he has been previously told that  he has some kidney issues.  Reassured that symptoms appeared significantly improved after improvement of blood pressure control.  On reassessment, he is able to ambulate without any issues.  Therefore, feel that he is safe for discharge home.  Recommended close outpatient follow-up, particularly with his PCP to readjust his blood pressure medications.  Keflex initiated for UTI and Antivert for his dizziness.  Strict return precautions were provided.  He and his family understands and agrees with this plan.  Patient HDS, nontoxic, ambulatory on reevaluation, subsequently discharged.  Final Clinical Impression(s) / ED Diagnoses Final diagnoses:  Dizziness  Hypertension, unspecified type    Rx / DC Orders ED Discharge Orders          Ordered    cephALEXin (KEFLEX) 500 MG capsule  3 times daily        10/31/20 1939    meclizine (ANTIVERT) 25 MG tablet  3 times daily PRN        10/31/20 1939             Colvin Caroli, MD 11/01/20 0300    Colvin Caroli, MD 11/01/20 0301    Rozelle Logan, DO 11/01/20 1942

## 2020-11-02 LAB — URINE CULTURE: Culture: >=100000 — AB

## 2020-11-03 ENCOUNTER — Telehealth: Payer: Self-pay | Admitting: *Deleted

## 2020-11-03 NOTE — Telephone Encounter (Signed)
Post ED Visit - Positive Culture Follow-up  Culture report reviewed by antimicrobial stewardship pharmacist: [facility] []  , Pharm.D. []  Enzo Bi, Pharm.D., BCPS AQ-ID []  , Pharm.D., BCPS []  Celedonio Miyamoto, .D., BCPS []  [place], .D., BCPS, AAHIVP []  Georgina Pillion, Pharm.D., BCPS, AAHIVP []  [address], PharmD, BCPS []  , PharmD, BCPS []  [place], PharmD, BCPS []  [place], PharmD []  , PharmD, BCPS []  Estella Husk, PharmD  Pharmacy Team []  Lysle Pearl, PharmD []  , PharmD []  Phillips Climes, PharmD []  , Rph []  Agapito Games) , PharmD []  Verlan Friends, PharmD []  , PharmD []  Mervyn Gay, PharmD []  , PharmD []  Vinnie Level, PharmD []  [facility], PharmD []  , PharmD []  Len Childs, PharmD   Positive urine culture Treated with Cephalexin, organism sensitive to the same and no further patient follow-up is required at this time.  , Pharm D  Greer Pickerel [place] 11/03/2020, 12:59 PM

## 2020-11-08 DIAGNOSIS — R42 Dizziness and giddiness: Secondary | ICD-10-CM | POA: Diagnosis not present

## 2020-11-08 DIAGNOSIS — E538 Deficiency of other specified B group vitamins: Secondary | ICD-10-CM | POA: Diagnosis not present

## 2020-11-08 DIAGNOSIS — E611 Iron deficiency: Secondary | ICD-10-CM | POA: Diagnosis not present

## 2020-11-08 DIAGNOSIS — H9319 Tinnitus, unspecified ear: Secondary | ICD-10-CM | POA: Diagnosis not present

## 2020-11-08 DIAGNOSIS — I10 Essential (primary) hypertension: Secondary | ICD-10-CM | POA: Diagnosis not present

## 2020-12-29 DIAGNOSIS — H903 Sensorineural hearing loss, bilateral: Secondary | ICD-10-CM | POA: Diagnosis not present

## 2020-12-29 DIAGNOSIS — H9312 Tinnitus, left ear: Secondary | ICD-10-CM | POA: Diagnosis not present

## 2021-04-07 ENCOUNTER — Emergency Department (HOSPITAL_COMMUNITY): Payer: Medicare HMO

## 2021-04-07 ENCOUNTER — Emergency Department (HOSPITAL_COMMUNITY)
Admission: EM | Admit: 2021-04-07 | Discharge: 2021-04-08 | Disposition: A | Discharge status: Home / Self Care | Payer: Medicare HMO | Attending: Emergency Medicine | Admitting: Emergency Medicine

## 2021-04-07 DIAGNOSIS — Z79899 Other long term (current) drug therapy: Secondary | ICD-10-CM | POA: Diagnosis not present

## 2021-04-07 DIAGNOSIS — N390 Urinary tract infection, site not specified: Secondary | ICD-10-CM | POA: Insufficient documentation

## 2021-04-07 DIAGNOSIS — R42 Dizziness and giddiness: Secondary | ICD-10-CM | POA: Insufficient documentation

## 2021-04-07 DIAGNOSIS — Z7982 Long term (current) use of aspirin: Secondary | ICD-10-CM | POA: Diagnosis not present

## 2021-04-07 DIAGNOSIS — I10 Essential (primary) hypertension: Secondary | ICD-10-CM | POA: Diagnosis not present

## 2021-04-07 DIAGNOSIS — R531 Weakness: Secondary | ICD-10-CM | POA: Diagnosis not present

## 2021-04-07 DIAGNOSIS — G319 Degenerative disease of nervous system, unspecified: Secondary | ICD-10-CM | POA: Diagnosis not present

## 2021-04-07 DIAGNOSIS — I491 Atrial premature depolarization: Secondary | ICD-10-CM | POA: Diagnosis not present

## 2021-04-07 LAB — CBC WITH DIFFERENTIAL/PLATELET
Abs Immature Granulocytes: 0.11 10*3/uL — ABNORMAL HIGH (ref 0.00–0.07)
Basophils Absolute: 0.1 10*3/uL (ref 0.0–0.1)
Basophils Relative: 1 %
Eosinophils Absolute: 0 10*3/uL (ref 0.0–0.5)
Eosinophils Relative: 0 %
HCT: 35.9 % — ABNORMAL LOW (ref 39.0–52.0)
Hemoglobin: 12.6 g/dL — ABNORMAL LOW (ref 13.0–17.0)
Immature Granulocytes: 1 %
Lymphocytes Relative: 7 %
Lymphs Abs: 0.9 10*3/uL (ref 0.7–4.0)
MCH: 32.5 pg (ref 26.0–34.0)
MCHC: 35.1 g/dL (ref 30.0–36.0)
MCV: 92.5 fL (ref 80.0–100.0)
Monocytes Absolute: 0.6 10*3/uL (ref 0.1–1.0)
Monocytes Relative: 4 %
Neutro Abs: 11.1 10*3/uL — ABNORMAL HIGH (ref 1.7–7.7)
Neutrophils Relative %: 87 %
Platelets: 274 10*3/uL (ref 150–400)
RBC: 3.88 MIL/uL — ABNORMAL LOW (ref 4.22–5.81)
RDW: 11.9 % (ref 11.5–15.5)
WBC: 12.8 10*3/uL — ABNORMAL HIGH (ref 4.0–10.5)
nRBC: 0 % (ref 0.0–0.2)

## 2021-04-07 LAB — URINALYSIS, ROUTINE W REFLEX MICROSCOPIC
Bilirubin Urine: NEGATIVE
Glucose, UA: NEGATIVE mg/dL
Ketones, ur: NEGATIVE mg/dL
Nitrite: NEGATIVE
Protein, ur: 100 mg/dL — AB
Specific Gravity, Urine: 1.013 (ref 1.005–1.030)
WBC, UA: >50 WBC/hpf — ABNORMAL HIGH (ref 0–5)
pH: 9 — ABNORMAL HIGH (ref 5.0–8.0)

## 2021-04-07 LAB — COMPREHENSIVE METABOLIC PANEL
ALT: 22 U/L (ref 0–44)
AST: 21 U/L (ref 15–41)
Albumin: 3.9 g/dL (ref 3.5–5.0)
Alkaline Phosphatase: 45 U/L (ref 38–126)
Anion gap: 9 (ref 5–15)
BUN: 27 mg/dL — ABNORMAL HIGH (ref 8–23)
CO2: 20 mmol/L — ABNORMAL LOW (ref 22–32)
Calcium: 9.2 mg/dL (ref 8.9–10.3)
Chloride: 106 mmol/L (ref 98–111)
Creatinine, Ser: 1.4 mg/dL — ABNORMAL HIGH (ref 0.61–1.24)
GFR, Estimated: 53 mL/min — ABNORMAL LOW (ref >60)
Glucose, Bld: 149 mg/dL — ABNORMAL HIGH (ref 70–99)
Potassium: 3.8 mmol/L (ref 3.5–5.1)
Sodium: 135 mmol/L (ref 135–145)
Total Bilirubin: 0.9 mg/dL (ref 0.3–1.2)
Total Protein: 7.5 g/dL (ref 6.5–8.1)

## 2021-04-07 LAB — TROPONIN I (HIGH SENSITIVITY)
Troponin I (High Sensitivity): 8 ng/L (ref <18)
Troponin I (High Sensitivity): 19 ng/L — ABNORMAL HIGH (ref <18)
Troponin I (High Sensitivity): 24 ng/L — ABNORMAL HIGH (ref <18)

## 2021-04-07 MED ORDER — SODIUM CHLORIDE 0.9 % IV BOLUS
1000.0000 mL | Freq: Once | INTRAVENOUS | Status: AC
  Administered 2021-04-07: 20:00:00 1000 mL via INTRAVENOUS

## 2021-04-07 MED ORDER — LORAZEPAM 2 MG/ML IJ SOLN
2.0000 mg | Freq: Once | INTRAMUSCULAR | Status: AC
  Administered 2021-04-08: 2 mg via INTRAVENOUS
  Filled 2021-04-07: qty 1

## 2021-04-07 MED ORDER — SODIUM CHLORIDE 0.9 % IV SOLN
1.0000 g | Freq: Once | INTRAVENOUS | Status: AC
  Administered 2021-04-07: 22:00:00 1 g via INTRAVENOUS
  Filled 2021-04-07: qty 10

## 2021-04-07 MED ORDER — SODIUM CHLORIDE 0.9 % IV BOLUS
1000.0000 mL | Freq: Once | INTRAVENOUS | Status: AC
  Administered 2021-04-07: 21:00:00 1000 mL via INTRAVENOUS

## 2021-04-07 NOTE — ED Provider Notes (Signed)
°  Provider Note MRN:  [id number]  Arrival date & time: 04/08/21    ED Course and Medical Decision Making  Assumed care from Dr. Silverio Lay at shift change.  Sudden onset dizziness, fever and peripheral vertigo, obtaining MRI, third troponin, candidate for discharge if reassuring.  Troponin is considered flat upon repeat.  MRI is without acute stroke.  Patient is appropriate for discharge on antibiotics for UTI.  Procedures  Final Clinical Impressions(s) / ED Diagnoses     ICD-10-CM   1. Dizziness  R42     2. Lower urinary tract infectious disease  N39.0       ED Discharge Orders          Ordered    cephALEXin (KEFLEX) 500 MG capsule  3 times daily        04/08/21 0545              Discharge Instructions      You were evaluated in the Emergency Department and after careful evaluation, we did not find any emergent condition requiring admission or further testing in the hospital.  Your exam/testing today is overall reassuring.  Symptoms likely due to a urinary tract infection.  MRI did not show any evidence of stroke.  Recommend close follow-up with your regular doctor, take the antibiotics as directed.  Please return to the Emergency Department if you experience any worsening of your condition.   Thank you for allowing [place] to be a part of your care.      Elmer Sow. Pilar Plate, MD [facility] [email or web address]    Sabas Sous, MD 04/08/21 [phone]

## 2021-04-07 NOTE — ED Triage Notes (Signed)
Pt bib [facility] from home with c/o generalized weakness and dizziness. Dizziness is worse from laying flat to standing. Pt says this has happened before. No changes in BP from position changes. No neuro deficits.   EMS vitals 178/80 BP 70 HR 99% RA 213 CBG

## 2021-04-07 NOTE — ED Provider Notes (Signed)
[facility] EMERGENCY DEPARTMENT Provider Note   CSN: [id number] Arrival date & time: 04/07/21  1722     History No chief complaint on file.   Charles Hodges is a 70 y.o. male hx of HTN, HL, here presenting with dizziness.  Has a cute onset of dizziness when he stands up today.  Patient states that this is similar to when he came to the ED for vertigo in July.  Patient at that time had a UTI.  He also had MRI brain that was unremarkable.  He was discharged home with antibiotics and meclizine.  Denies any trauma or fall.  Patient states that he felt the room spinning unable to stand up because he has severe vertigo.  Denies any trouble speaking or weakness or numbness  The history is provided by the patient.      Past Medical History:  Diagnosis Date   Hyperlipidemia    Hypertension     There are no problems to display for this patient.   No past surgical history on file.     No family history on file.  Social History   Tobacco Use   Smoking status: Never  Substance Use Topics   Alcohol use: No   Drug use: No    Home Medications Prior to Admission medications   Medication Sig Start Date End Date Taking? Authorizing Provider  acetaminophen (TYLENOL) 500 MG tablet Take 1,000 mg by mouth every 4 (four) hours as needed for moderate pain.    [provider]  aspirin EC 81 MG tablet Take 81 mg by mouth daily.    [provider]  benazepril-hydrochlorthiazide (LOTENSIN HCT) 20-25 MG per tablet Take 1 tablet by mouth daily.    [provider]  citalopram (CELEXA) 20 MG tablet Take 20 mg by mouth daily.    [provider]  ibuprofen (ADVIL,MOTRIN) 400 MG tablet Take 1 tablet (400 mg total) by mouth every 8 (eight) hours as needed. 05/01/14   Sciacca, Marissa, PA-C  labetalol (NORMODYNE) 200 MG tablet Take 200 mg by mouth daily.    [provider]  meclizine (ANTIVERT) 25 MG tablet Take 1 tablet (25 mg total) by  mouth 3 (three) times daily as needed for dizziness. 10/31/20   Colvin Caroli, MD  simvastatin (ZOCOR) 80 MG tablet Take 80 mg by mouth daily.    [provider]    Allergies    Codeine  Review of Systems   Review of Systems  Neurological:  Positive for dizziness and light-headedness.  All other systems reviewed and are negative.  Physical Exam Updated Vital Signs There were no vitals taken for this visit.  Physical Exam Vitals and nursing note reviewed.  Constitutional:      Appearance: Normal appearance.  HENT:     Head: Normocephalic.     Nose: Nose normal.     Mouth/Throat:     Mouth: Mucous membranes are moist.  Eyes:     Comments: Rotatory nystagmus towards the left and down  Cardiovascular:     Rate and Rhythm: Normal rate and regular rhythm.     Pulses: Normal pulses.     Heart sounds: Normal heart sounds.  Pulmonary:     Effort: Pulmonary effort is normal.     Breath sounds: Normal breath sounds.  Abdominal:     General: Abdomen is flat.     Palpations: Abdomen is soft.  Musculoskeletal:        General: Normal range of  motion.     Cervical back: Normal range of motion and neck supple.  Skin:    General: Skin is warm.     Capillary Refill: Capillary refill takes less than 2 seconds.  Neurological:     Mental Status: He is alert.     Comments: No obvious facial droop.  Patient has normal speech.  Patient unable to do finger-to-nose since he is seeing double.  Patient unable to stand up due to dizziness.  Psychiatric:        Mood and Affect: Mood normal.        Behavior: Behavior normal.    ED Results / Procedures / Treatments   Labs (all labs ordered are listed, but only abnormal results are displayed) Labs Reviewed  CBC WITH DIFFERENTIAL/PLATELET - Abnormal; Notable for the following components:      Result Value   WBC 12.8 (*)    RBC 3.88 (*)    Hemoglobin 12.6 (*)    HCT 35.9 (*)    Neutro Abs 11.1 (*)    Abs Immature  Granulocytes 0.11 (*)    All other components within normal limits  COMPREHENSIVE METABOLIC PANEL  URINALYSIS, ROUTINE W REFLEX MICROSCOPIC  TROPONIN I (HIGH SENSITIVITY)    EKG EKG Interpretation  Date/Time:  Friday April 07 2021 17:25:51 EST Ventricular Rate:  66 PR Interval:  200 QRS Duration: 90 QT Interval:  430 QTC Calculation: 451 R Axis:     Text Interpretation: Normal sinus rhythm No significant change since last tracing Confirmed by Richardean Canal [id number]) on 04/07/2021 6:20:31 PM  Radiology No results found.  Procedures Procedures   Medications Ordered in ED Medications  sodium chloride 0.9 % bolus 1,000 mL (has no administration in time range)    ED Course  I have reviewed the triage vital signs and the nursing notes.  Pertinent labs & imaging results that were available during my care of the patient were reviewed by me and considered in my medical decision making (see chart for details).    MDM Rules/Calculators/A&P This patient presents to the ED for concern of vertigo, this involves an extensive number of treatment options, and is a complaint that carries with it a high risk of complications and morbidity.  The differential diagnosis includes central stroke versus peripheral vertigo   Co morbidities that complicate the patient evaluation Hypertension, high   Additional history obtained: Additional history obtained from chart review External records from outside source obtained and reviewed including recent ED visit for vertigo and was diagnosed with UTI   Lab Tests: I Ordered, and personally interpreted labs.  The pertinent results include: Urinalysis showed positive for UTI.  Patient's initial troponin is negative and second troponin is 19.  Third troponin ordered   Imaging Studies ordered: I ordered imaging studies including CT head which showed no bleed I independently visualized and interpreted imaging which showed no obvious bleeding and CT  head I agree with the radiologist interpretation   Cardiac Monitoring: The patient was maintained on a cardiac monitor.  I personally viewed and interpreted the cardiac monitored which showed an underlying rhythm of: Normal sinus   Medicines ordered and prescription drug management: I ordered medication including meclizine  for dizziness and rocephin for UTI Reevaluation of the patient after these medicines showed that the patient improved I have reviewed the patients home medicines and have made adjustments as needed   Test Considered: cbc, cmp, trop, CT head, MRI brain   Critical Interventions: I ordered meclizine  for vertigo and symptoms improved.  Patient also received Rocephin for UTI.  Second troponin mildly elevated so third troponin ordered.  MRI brain also ordered and pending at signout.  Signed out to Dr. Pilar Plate in the ED.    Consultations Obtained: none  Problem List / ED Course: Patient has UTI    Reevaluation: After the interventions noted above, I reevaluated the patient and found that they have :improved    Dispostion: Signed out to Dr. Pilar Plate                              Final Clinical Impression(s) / ED Diagnoses Final diagnoses:  None    Rx / DC Orders ED Discharge Orders     None        Charlynne Pander, MD 04/07/21 2314

## 2021-04-08 ENCOUNTER — Encounter (HOSPITAL_COMMUNITY): Payer: Self-pay

## 2021-04-08 ENCOUNTER — Emergency Department (HOSPITAL_COMMUNITY): Payer: Medicare HMO

## 2021-04-08 ENCOUNTER — Other Ambulatory Visit: Payer: Self-pay

## 2021-04-08 DIAGNOSIS — G319 Degenerative disease of nervous system, unspecified: Secondary | ICD-10-CM | POA: Diagnosis not present

## 2021-04-08 MED ORDER — CEPHALEXIN 500 MG PO CAPS: 500.0000 mg | ORAL_CAPSULE | Freq: Three times a day (TID) | ORAL | 0 refills | Status: AC

## 2021-04-08 NOTE — Discharge Instructions (Signed)
You were evaluated in the Emergency Department and after careful evaluation, we did not find any emergent condition requiring admission or further testing in the hospital.  Your exam/testing today is overall reassuring.  Symptoms likely due to a urinary tract infection.  MRI did not show any evidence of stroke.  Recommend close follow-up with your regular doctor, take the antibiotics as directed.  Please return to the Emergency Department if you experience any worsening of your condition.   Thank you for allowing [place] to be a part of your care.

## 2021-04-08 NOTE — ED Notes (Signed)
Patient transported to MRI 

## 2021-04-08 NOTE — ED Notes (Signed)
Pt back from MRI 

## 2021-04-10 LAB — URINE CULTURE: Culture: >=100000 — AB

## 2021-04-11 ENCOUNTER — Telehealth: Payer: Self-pay

## 2021-04-11 NOTE — Telephone Encounter (Signed)
Post ED Visit - Positive Culture Follow-up: Successful Patient Follow-Up  Culture assessed and recommendations reviewed by:  []  , Pharm.D. [x]  Enzo Bi, .D., BCPS AQ-ID []  Celedonio Miyamoto, Pharm.D., BCPS []  [address], .D., BCPS []  [place], .D., BCPS, AAHIVP []  Georgina Pillion, Pharm.D., BCPS, AAHIVP []  [address], PharmD, BCPS []  , PharmD, BCPS []  [place], PharmD, BCPS []  [address], PharmD  Positive urine culture  []  Patient discharged without antimicrobial prescription and treatment is now indicated [x]  Organism is resistant to prescribed ED discharge antimicrobial []  Patient with positive blood cultures  Changes discussed with ED provider: , MD Plan f pt has urinary symptoms start Cipro 500 mg po BID x 5 days. Spoke with pt, pt states he is feeling better and denies any urinary/UTI symptoms. No new meds called in per plan.    Contacted patient, date 04/11/2021, time 11:38 am   04/11/2021, 11:38 AM

## 2021-04-17 DIAGNOSIS — R350 Frequency of micturition: Secondary | ICD-10-CM | POA: Diagnosis not present

## 2021-04-17 DIAGNOSIS — I10 Essential (primary) hypertension: Secondary | ICD-10-CM | POA: Diagnosis not present

## 2021-04-17 DIAGNOSIS — R42 Dizziness and giddiness: Secondary | ICD-10-CM | POA: Diagnosis not present

## 2021-06-10 DIAGNOSIS — W540XXA Bitten by dog, initial encounter: Secondary | ICD-10-CM | POA: Diagnosis not present

## 2021-06-10 DIAGNOSIS — S41132A Puncture wound without foreign body of left upper arm, initial encounter: Secondary | ICD-10-CM | POA: Diagnosis not present

## 2021-08-03 DIAGNOSIS — R7301 Impaired fasting glucose: Secondary | ICD-10-CM | POA: Diagnosis not present

## 2021-08-03 DIAGNOSIS — F329 Major depressive disorder, single episode, unspecified: Secondary | ICD-10-CM | POA: Diagnosis not present

## 2021-08-03 DIAGNOSIS — Z0000 Encounter for general adult medical examination without abnormal findings: Secondary | ICD-10-CM | POA: Diagnosis not present

## 2021-08-03 DIAGNOSIS — E611 Iron deficiency: Secondary | ICD-10-CM | POA: Diagnosis not present

## 2021-08-03 DIAGNOSIS — R351 Nocturia: Secondary | ICD-10-CM | POA: Diagnosis not present

## 2021-08-03 DIAGNOSIS — E785 Hyperlipidemia, unspecified: Secondary | ICD-10-CM | POA: Diagnosis not present

## 2021-08-03 DIAGNOSIS — E538 Deficiency of other specified B group vitamins: Secondary | ICD-10-CM | POA: Diagnosis not present

## 2021-08-03 DIAGNOSIS — M109 Gout, unspecified: Secondary | ICD-10-CM | POA: Diagnosis not present

## 2021-08-03 DIAGNOSIS — D649 Anemia, unspecified: Secondary | ICD-10-CM | POA: Diagnosis not present

## 2021-08-03 DIAGNOSIS — N183 Chronic kidney disease, stage 3 unspecified: Secondary | ICD-10-CM | POA: Diagnosis not present

## 2022-02-16 DIAGNOSIS — K6289 Other specified diseases of anus and rectum: Secondary | ICD-10-CM | POA: Diagnosis not present

## 2022-02-16 DIAGNOSIS — Z1211 Encounter for screening for malignant neoplasm of colon: Secondary | ICD-10-CM | POA: Diagnosis not present

## 2022-02-20 DIAGNOSIS — H2522 Age-related cataract, morgagnian type, left eye: Secondary | ICD-10-CM | POA: Diagnosis not present

## 2022-02-20 DIAGNOSIS — Z961 Presence of intraocular lens: Secondary | ICD-10-CM | POA: Diagnosis not present

## 2022-03-16 DIAGNOSIS — H278 Other specified disorders of lens: Secondary | ICD-10-CM | POA: Diagnosis not present

## 2022-03-16 DIAGNOSIS — H2702 Aphakia, left eye: Secondary | ICD-10-CM | POA: Diagnosis not present

## 2022-03-16 DIAGNOSIS — H59022 Cataract (lens) fragments in eye following cataract surgery, left eye: Secondary | ICD-10-CM | POA: Diagnosis not present

## 2022-03-16 DIAGNOSIS — H2589 Other age-related cataract: Secondary | ICD-10-CM | POA: Diagnosis not present

## 2022-04-12 DIAGNOSIS — H33012 Retinal detachment with single break, left eye: Secondary | ICD-10-CM | POA: Diagnosis not present

## 2022-04-13 DIAGNOSIS — H33012 Retinal detachment with single break, left eye: Secondary | ICD-10-CM | POA: Diagnosis not present

## 2022-04-13 DIAGNOSIS — H26492 Other secondary cataract, left eye: Secondary | ICD-10-CM | POA: Diagnosis not present

## 2022-04-18 DIAGNOSIS — H33012 Retinal detachment with single break, left eye: Secondary | ICD-10-CM | POA: Diagnosis not present

## 2022-06-06 DIAGNOSIS — H33012 Retinal detachment with single break, left eye: Secondary | ICD-10-CM | POA: Diagnosis not present

## 2022-07-18 DIAGNOSIS — H26492 Other secondary cataract, left eye: Secondary | ICD-10-CM | POA: Diagnosis not present

## 2022-07-18 DIAGNOSIS — H33012 Retinal detachment with single break, left eye: Secondary | ICD-10-CM | POA: Diagnosis not present

## 2022-07-18 DIAGNOSIS — H59022 Cataract (lens) fragments in eye following cataract surgery, left eye: Secondary | ICD-10-CM | POA: Diagnosis not present

## 2022-08-03 DIAGNOSIS — R7303 Prediabetes: Secondary | ICD-10-CM | POA: Diagnosis not present

## 2022-08-03 DIAGNOSIS — M109 Gout, unspecified: Secondary | ICD-10-CM | POA: Diagnosis not present

## 2022-08-03 DIAGNOSIS — M7989 Other specified soft tissue disorders: Secondary | ICD-10-CM | POA: Diagnosis not present

## 2022-08-03 DIAGNOSIS — D649 Anemia, unspecified: Secondary | ICD-10-CM | POA: Diagnosis not present

## 2022-08-03 DIAGNOSIS — M25569 Pain in unspecified knee: Secondary | ICD-10-CM | POA: Diagnosis not present

## 2022-08-03 DIAGNOSIS — M79673 Pain in unspecified foot: Secondary | ICD-10-CM | POA: Diagnosis not present

## 2022-08-03 DIAGNOSIS — N183 Chronic kidney disease, stage 3 unspecified: Secondary | ICD-10-CM | POA: Diagnosis not present

## 2022-08-17 DIAGNOSIS — Z0000 Encounter for general adult medical examination without abnormal findings: Secondary | ICD-10-CM | POA: Diagnosis not present

## 2022-08-17 DIAGNOSIS — R7301 Impaired fasting glucose: Secondary | ICD-10-CM | POA: Diagnosis not present

## 2022-08-17 DIAGNOSIS — N183 Chronic kidney disease, stage 3 unspecified: Secondary | ICD-10-CM | POA: Diagnosis not present

## 2022-08-17 DIAGNOSIS — E611 Iron deficiency: Secondary | ICD-10-CM | POA: Diagnosis not present

## 2022-08-17 DIAGNOSIS — D649 Anemia, unspecified: Secondary | ICD-10-CM | POA: Diagnosis not present

## 2022-08-17 DIAGNOSIS — F329 Major depressive disorder, single episode, unspecified: Secondary | ICD-10-CM | POA: Diagnosis not present

## 2022-08-17 DIAGNOSIS — E785 Hyperlipidemia, unspecified: Secondary | ICD-10-CM | POA: Diagnosis not present

## 2022-08-17 DIAGNOSIS — I10 Essential (primary) hypertension: Secondary | ICD-10-CM | POA: Diagnosis not present

## 2022-08-17 DIAGNOSIS — R351 Nocturia: Secondary | ICD-10-CM | POA: Diagnosis not present

## 2022-08-29 ENCOUNTER — Ambulatory Visit
Admission: RE | Admit: 2022-08-29 | Discharge: 2022-08-29 | Disposition: A | Discharge status: Home / Self Care | Payer: Medicare HMO | Source: Ambulatory Visit | Attending: Family Medicine | Admitting: Family Medicine

## 2022-08-29 ENCOUNTER — Other Ambulatory Visit: Payer: Self-pay | Admitting: Family Medicine

## 2022-08-29 DIAGNOSIS — M79642 Pain in left hand: Secondary | ICD-10-CM

## 2022-08-29 DIAGNOSIS — M19042 Primary osteoarthritis, left hand: Secondary | ICD-10-CM | POA: Diagnosis not present

## 2022-11-15 DIAGNOSIS — H33012 Retinal detachment with single break, left eye: Secondary | ICD-10-CM | POA: Diagnosis not present

## 2022-11-15 DIAGNOSIS — H59022 Cataract (lens) fragments in eye following cataract surgery, left eye: Secondary | ICD-10-CM | POA: Diagnosis not present

## 2022-11-15 DIAGNOSIS — H2702 Aphakia, left eye: Secondary | ICD-10-CM | POA: Diagnosis not present

## 2022-11-15 DIAGNOSIS — H26492 Other secondary cataract, left eye: Secondary | ICD-10-CM | POA: Diagnosis not present

## 2022-12-06 DIAGNOSIS — M25662 Stiffness of left knee, not elsewhere classified: Secondary | ICD-10-CM | POA: Diagnosis not present

## 2022-12-06 DIAGNOSIS — R262 Difficulty in walking, not elsewhere classified: Secondary | ICD-10-CM | POA: Diagnosis not present

## 2022-12-06 DIAGNOSIS — M25562 Pain in left knee: Secondary | ICD-10-CM | POA: Diagnosis not present

## 2022-12-06 DIAGNOSIS — M1712 Unilateral primary osteoarthritis, left knee: Secondary | ICD-10-CM | POA: Diagnosis not present

## 2023-02-17 IMAGING — CT CT HEAD W/O CM
4 series · 16 of 47 positions shown, 18 images · non-contrast
Comparison: MR head without contrast and CT head without contrast
10/31/2020.

CLINICAL DATA: TIA.  Dizziness, persistent or recurrent.

EXAM:
CT HEAD WITHOUT CONTRAST
TECHNIQUE: Contiguous axial images were obtained from the base of the skull
through the vertex without intravenous contrast.

[Series 3: head wo · axial · 0.49mm/px · z∈[-196,-61]mm · 7 of 37 slices shown, 9 images]
[im 5/37  brain]
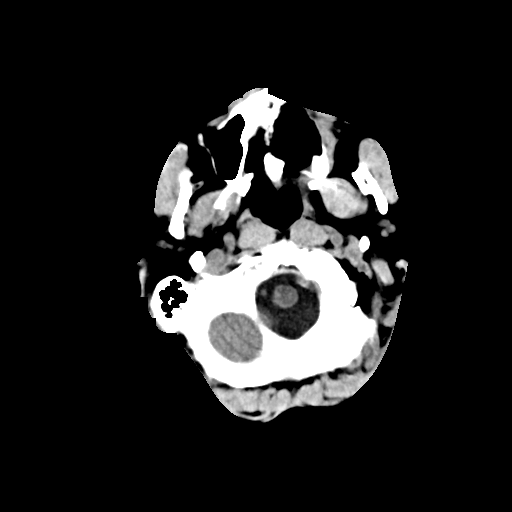
[im 5/37  bone]
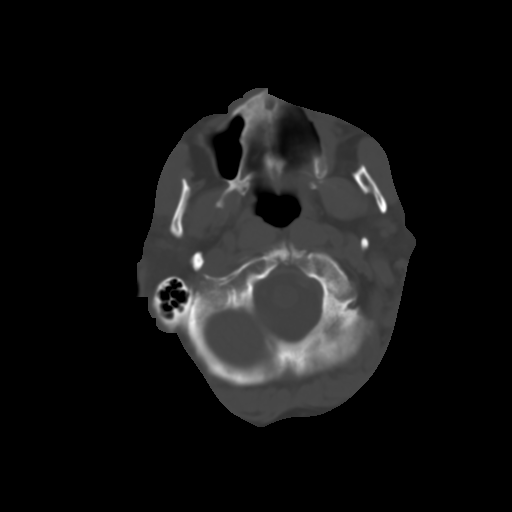
[im 10/37  brain]
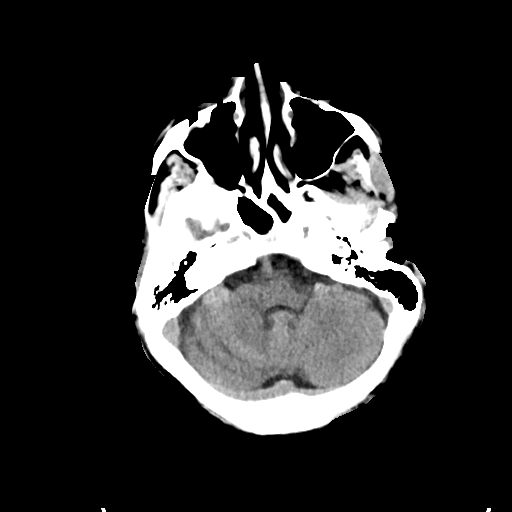
[im 14/37  brain]
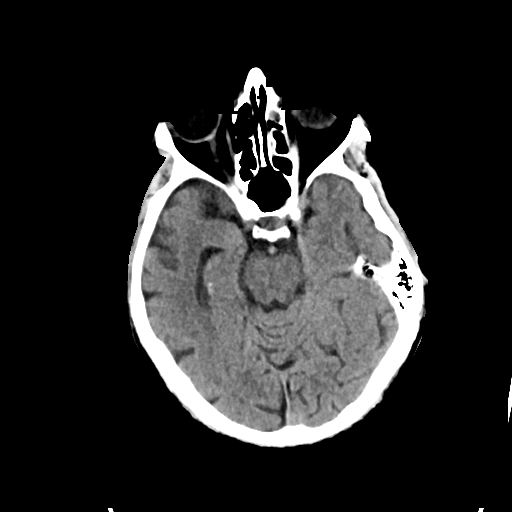
[im 19/37  brain]
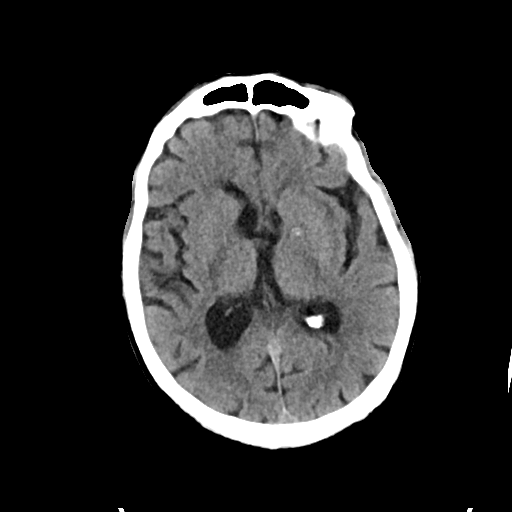
[im 23/37  brain]
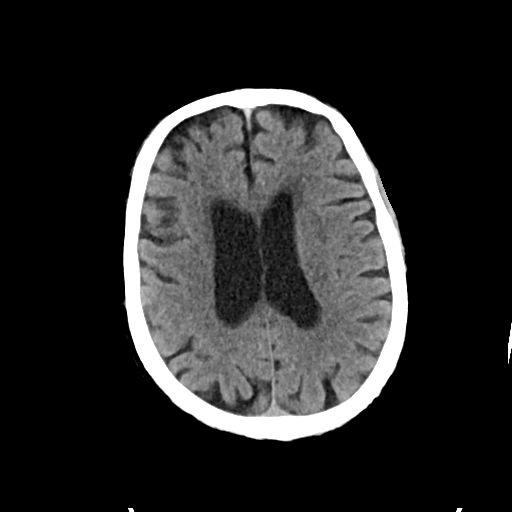
[im 23/37  bone]
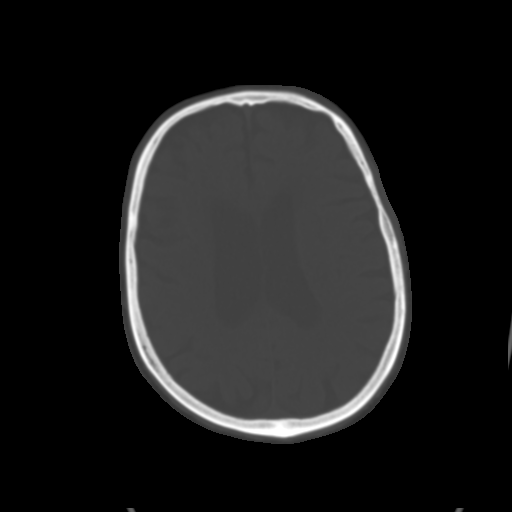
[im 28/37  brain]
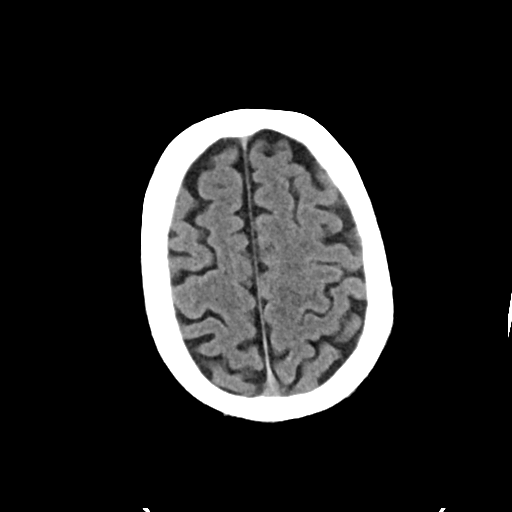
[im 32/37  brain]
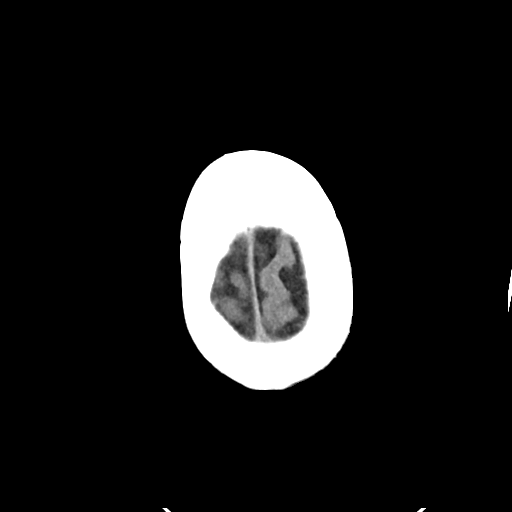

[Series 4: head bone · axial · 0.49mm/px · z∈[-198,-162]mm · 3 of 93 slices shown]
[im 10/93  bone]
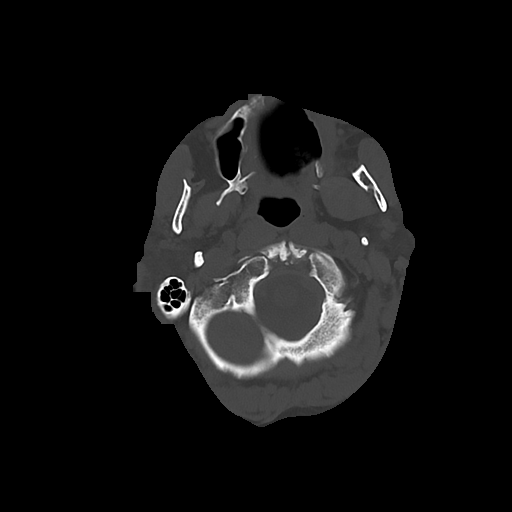
[im 19/93  bone]
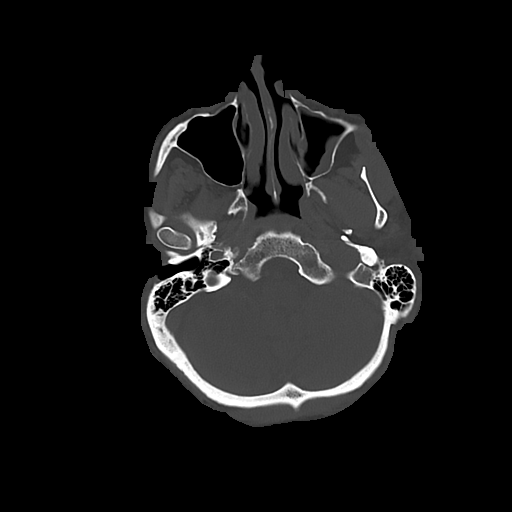
[im 28/93  bone]
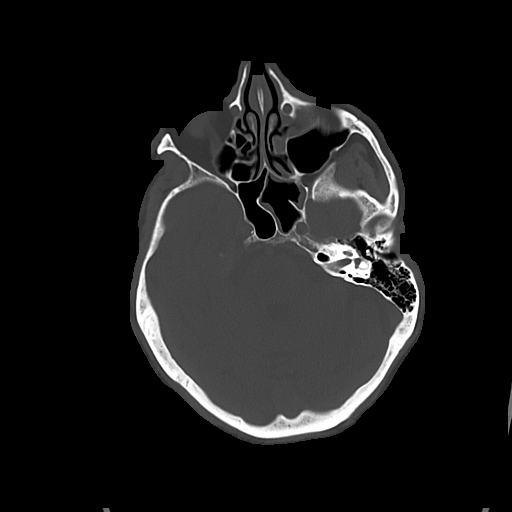

[Series 5: cor soft · coronal · 0.36mm/px · 3 of 80 slices shown]
[im 27/80  brain]
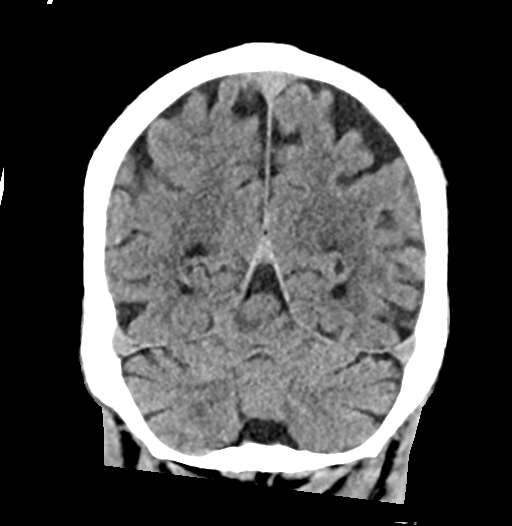
[im 36/80  brain]
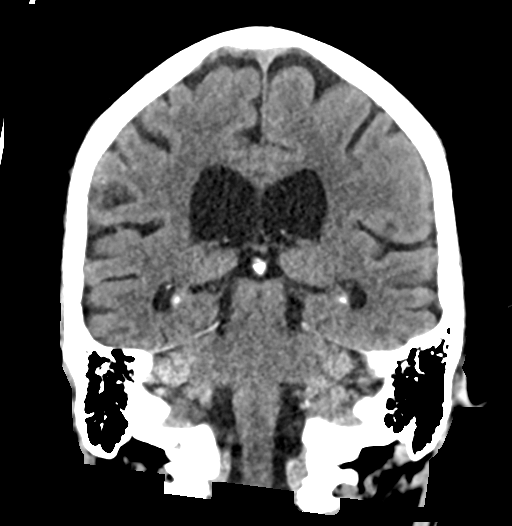
[im 44/80  brain]
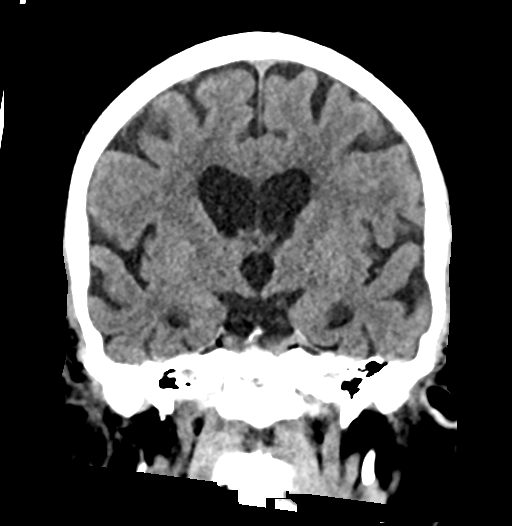

[Series 6: sag soft · sagittal · 0.37mm/px · 3 of 58 slices shown]
[im 21/58  brain]
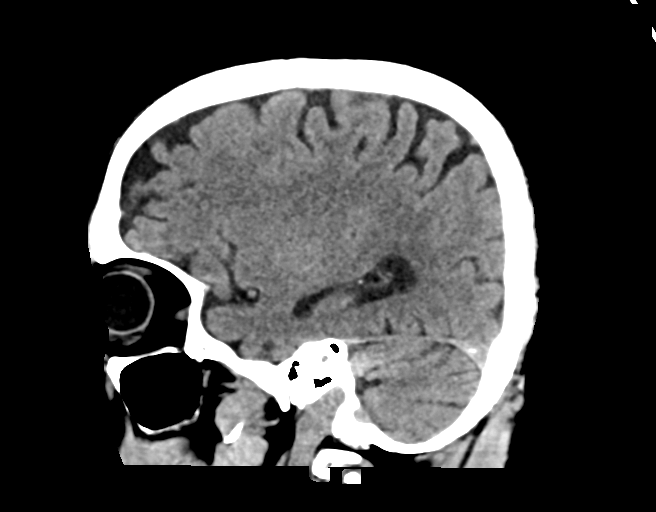
[im 29/58  brain]
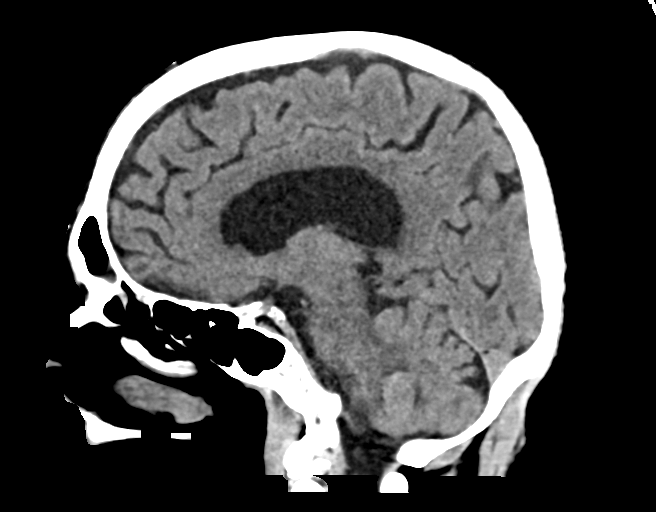
[im 38/58  brain]
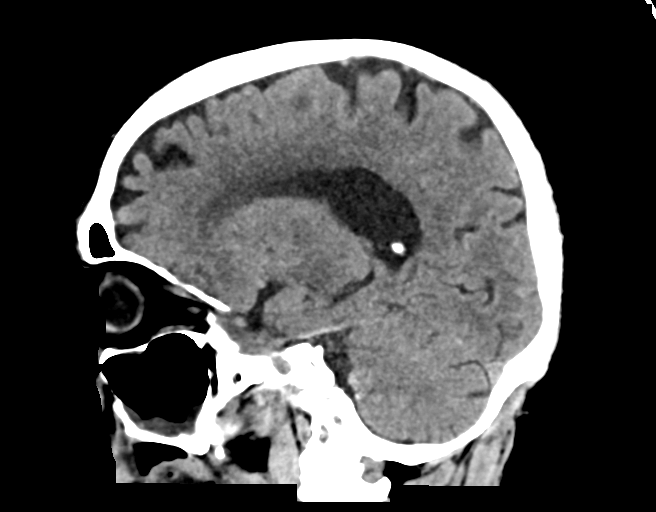

[16 of 47 positions shown; findings below may reference images not displayed]

FINDINGS: Brain: No acute infarct, hemorrhage, or mass lesion is present. Mild
atrophy and white matter changes are stable. The ventricles are
proportionate to the degree of atrophy. No significant extraaxial
fluid collection is present.

Vascular: Atherosclerotic calcifications are present within the
cavernous internal carotid arteries bilaterally. No hyperdense
vessel is present.

Skull: Calvarium is intact. No focal lytic or blastic lesions are
present. No significant extracranial soft tissue lesion is present.

Sinuses/Orbits: Asymmetric mucosal thickening is present in the left
greater than right anterior paranasal sinuses. Mild circumferential
mucosal thickening is also present in the left sphenoid sinus. The
mastoid air cells are clear. Bilateral lens replacements are noted.
Globes and orbits are otherwise unremarkable.
IMPRESSION: 1. Stable atrophy and white matter disease. This likely reflects the
sequela of chronic microvascular ischemia.
2. Atherosclerosis.
3. Minimal sinus disease.

## 2023-02-22 DIAGNOSIS — N183 Chronic kidney disease, stage 3 unspecified: Secondary | ICD-10-CM | POA: Diagnosis not present

## 2023-02-22 DIAGNOSIS — E538 Deficiency of other specified B group vitamins: Secondary | ICD-10-CM | POA: Diagnosis not present

## 2023-02-22 DIAGNOSIS — Z23 Encounter for immunization: Secondary | ICD-10-CM | POA: Diagnosis not present

## 2023-02-22 DIAGNOSIS — N401 Enlarged prostate with lower urinary tract symptoms: Secondary | ICD-10-CM | POA: Diagnosis not present

## 2023-02-22 DIAGNOSIS — E611 Iron deficiency: Secondary | ICD-10-CM | POA: Diagnosis not present

## 2023-02-22 DIAGNOSIS — D649 Anemia, unspecified: Secondary | ICD-10-CM | POA: Diagnosis not present

## 2023-02-22 DIAGNOSIS — R7301 Impaired fasting glucose: Secondary | ICD-10-CM | POA: Diagnosis not present

## 2023-02-22 DIAGNOSIS — E785 Hyperlipidemia, unspecified: Secondary | ICD-10-CM | POA: Diagnosis not present

## 2023-02-22 DIAGNOSIS — I10 Essential (primary) hypertension: Secondary | ICD-10-CM | POA: Diagnosis not present

## 2023-08-12 DIAGNOSIS — H2702 Aphakia, left eye: Secondary | ICD-10-CM | POA: Diagnosis not present

## 2023-08-12 DIAGNOSIS — H59022 Cataract (lens) fragments in eye following cataract surgery, left eye: Secondary | ICD-10-CM | POA: Diagnosis not present

## 2023-08-12 DIAGNOSIS — H33012 Retinal detachment with single break, left eye: Secondary | ICD-10-CM | POA: Diagnosis not present

## 2023-08-12 DIAGNOSIS — T8529XA Other mechanical complication of intraocular lens, initial encounter: Secondary | ICD-10-CM | POA: Diagnosis not present

## 2023-08-12 DIAGNOSIS — Z9889 Other specified postprocedural states: Secondary | ICD-10-CM | POA: Diagnosis not present

## 2023-08-12 DIAGNOSIS — H26492 Other secondary cataract, left eye: Secondary | ICD-10-CM | POA: Diagnosis not present

## 2023-09-03 DIAGNOSIS — I10 Essential (primary) hypertension: Secondary | ICD-10-CM | POA: Diagnosis not present

## 2023-09-03 DIAGNOSIS — N183 Chronic kidney disease, stage 3 unspecified: Secondary | ICD-10-CM | POA: Diagnosis not present

## 2023-09-04 DIAGNOSIS — Z0000 Encounter for general adult medical examination without abnormal findings: Secondary | ICD-10-CM | POA: Diagnosis not present

## 2023-09-04 DIAGNOSIS — R7301 Impaired fasting glucose: Secondary | ICD-10-CM | POA: Diagnosis not present

## 2023-09-04 DIAGNOSIS — E785 Hyperlipidemia, unspecified: Secondary | ICD-10-CM | POA: Diagnosis not present

## 2023-09-04 DIAGNOSIS — D649 Anemia, unspecified: Secondary | ICD-10-CM | POA: Diagnosis not present

## 2023-09-04 DIAGNOSIS — N183 Chronic kidney disease, stage 3 unspecified: Secondary | ICD-10-CM | POA: Diagnosis not present

## 2023-09-04 DIAGNOSIS — I10 Essential (primary) hypertension: Secondary | ICD-10-CM | POA: Diagnosis not present

## 2023-09-04 DIAGNOSIS — F329 Major depressive disorder, single episode, unspecified: Secondary | ICD-10-CM | POA: Diagnosis not present

## 2023-09-04 DIAGNOSIS — Z23 Encounter for immunization: Secondary | ICD-10-CM | POA: Diagnosis not present

## 2023-09-04 DIAGNOSIS — E611 Iron deficiency: Secondary | ICD-10-CM | POA: Diagnosis not present

## 2023-09-07 DIAGNOSIS — N183 Chronic kidney disease, stage 3 unspecified: Secondary | ICD-10-CM | POA: Diagnosis not present

## 2023-09-07 DIAGNOSIS — E785 Hyperlipidemia, unspecified: Secondary | ICD-10-CM | POA: Diagnosis not present

## 2023-09-07 DIAGNOSIS — F329 Major depressive disorder, single episode, unspecified: Secondary | ICD-10-CM | POA: Diagnosis not present

## 2023-09-07 DIAGNOSIS — N401 Enlarged prostate with lower urinary tract symptoms: Secondary | ICD-10-CM | POA: Diagnosis not present

## 2023-09-07 DIAGNOSIS — I10 Essential (primary) hypertension: Secondary | ICD-10-CM | POA: Diagnosis not present

## 2023-09-23 DIAGNOSIS — T8529XA Other mechanical complication of intraocular lens, initial encounter: Secondary | ICD-10-CM | POA: Diagnosis not present

## 2023-09-23 DIAGNOSIS — H26492 Other secondary cataract, left eye: Secondary | ICD-10-CM | POA: Diagnosis not present

## 2023-09-23 DIAGNOSIS — H33012 Retinal detachment with single break, left eye: Secondary | ICD-10-CM | POA: Diagnosis not present

## 2023-09-23 DIAGNOSIS — H2702 Aphakia, left eye: Secondary | ICD-10-CM | POA: Diagnosis not present

## 2023-10-02 DIAGNOSIS — N289 Disorder of kidney and ureter, unspecified: Secondary | ICD-10-CM | POA: Diagnosis not present

## 2023-10-06 DIAGNOSIS — I10 Essential (primary) hypertension: Secondary | ICD-10-CM | POA: Diagnosis not present

## 2023-10-06 DIAGNOSIS — N183 Chronic kidney disease, stage 3 unspecified: Secondary | ICD-10-CM | POA: Diagnosis not present

## 2023-10-07 DIAGNOSIS — F329 Major depressive disorder, single episode, unspecified: Secondary | ICD-10-CM | POA: Diagnosis not present

## 2023-10-07 DIAGNOSIS — I10 Essential (primary) hypertension: Secondary | ICD-10-CM | POA: Diagnosis not present

## 2023-10-07 DIAGNOSIS — N183 Chronic kidney disease, stage 3 unspecified: Secondary | ICD-10-CM | POA: Diagnosis not present

## 2023-10-07 DIAGNOSIS — E785 Hyperlipidemia, unspecified: Secondary | ICD-10-CM | POA: Diagnosis not present

## 2023-10-07 DIAGNOSIS — N401 Enlarged prostate with lower urinary tract symptoms: Secondary | ICD-10-CM | POA: Diagnosis not present

## 2023-10-30 DIAGNOSIS — T8529XA Other mechanical complication of intraocular lens, initial encounter: Secondary | ICD-10-CM | POA: Diagnosis not present

## 2023-10-30 DIAGNOSIS — H59022 Cataract (lens) fragments in eye following cataract surgery, left eye: Secondary | ICD-10-CM | POA: Diagnosis not present

## 2023-11-07 DIAGNOSIS — N401 Enlarged prostate with lower urinary tract symptoms: Secondary | ICD-10-CM | POA: Diagnosis not present

## 2023-11-07 DIAGNOSIS — N183 Chronic kidney disease, stage 3 unspecified: Secondary | ICD-10-CM | POA: Diagnosis not present

## 2023-11-07 DIAGNOSIS — E785 Hyperlipidemia, unspecified: Secondary | ICD-10-CM | POA: Diagnosis not present

## 2023-11-07 DIAGNOSIS — I10 Essential (primary) hypertension: Secondary | ICD-10-CM | POA: Diagnosis not present

## 2023-11-07 DIAGNOSIS — F329 Major depressive disorder, single episode, unspecified: Secondary | ICD-10-CM | POA: Diagnosis not present

## 2023-12-08 DIAGNOSIS — I10 Essential (primary) hypertension: Secondary | ICD-10-CM | POA: Diagnosis not present

## 2023-12-08 DIAGNOSIS — N183 Chronic kidney disease, stage 3 unspecified: Secondary | ICD-10-CM | POA: Diagnosis not present

## 2024-01-07 DIAGNOSIS — F329 Major depressive disorder, single episode, unspecified: Secondary | ICD-10-CM | POA: Diagnosis not present

## 2024-01-07 DIAGNOSIS — E785 Hyperlipidemia, unspecified: Secondary | ICD-10-CM | POA: Diagnosis not present

## 2024-01-07 DIAGNOSIS — N401 Enlarged prostate with lower urinary tract symptoms: Secondary | ICD-10-CM | POA: Diagnosis not present

## 2024-01-07 DIAGNOSIS — N183 Chronic kidney disease, stage 3 unspecified: Secondary | ICD-10-CM | POA: Diagnosis not present

## 2024-01-07 DIAGNOSIS — I10 Essential (primary) hypertension: Secondary | ICD-10-CM | POA: Diagnosis not present

## 2024-02-07 DIAGNOSIS — N183 Chronic kidney disease, stage 3 unspecified: Secondary | ICD-10-CM | POA: Diagnosis not present

## 2024-02-07 DIAGNOSIS — I10 Essential (primary) hypertension: Secondary | ICD-10-CM | POA: Diagnosis not present

## 2024-02-07 DIAGNOSIS — F329 Major depressive disorder, single episode, unspecified: Secondary | ICD-10-CM | POA: Diagnosis not present

## 2024-02-07 DIAGNOSIS — N401 Enlarged prostate with lower urinary tract symptoms: Secondary | ICD-10-CM | POA: Diagnosis not present

## 2024-02-07 DIAGNOSIS — E785 Hyperlipidemia, unspecified: Secondary | ICD-10-CM | POA: Diagnosis not present

## 2024-02-10 DIAGNOSIS — T8529XA Other mechanical complication of intraocular lens, initial encounter: Secondary | ICD-10-CM | POA: Diagnosis not present

## 2024-02-10 DIAGNOSIS — Z9889 Other specified postprocedural states: Secondary | ICD-10-CM | POA: Diagnosis not present

## 2024-02-10 DIAGNOSIS — H59022 Cataract (lens) fragments in eye following cataract surgery, left eye: Secondary | ICD-10-CM | POA: Diagnosis not present

## 2024-02-10 DIAGNOSIS — H33012 Retinal detachment with single break, left eye: Secondary | ICD-10-CM | POA: Diagnosis not present

## 2024-02-10 DIAGNOSIS — H2702 Aphakia, left eye: Secondary | ICD-10-CM | POA: Diagnosis not present

## 2024-02-10 DIAGNOSIS — H26492 Other secondary cataract, left eye: Secondary | ICD-10-CM | POA: Diagnosis not present

## 2024-02-10 DIAGNOSIS — H353132 Nonexudative age-related macular degeneration, bilateral, intermediate dry stage: Secondary | ICD-10-CM | POA: Diagnosis not present

## 2024-02-20 DIAGNOSIS — F419 Anxiety disorder, unspecified: Secondary | ICD-10-CM | POA: Diagnosis not present

## 2024-02-20 DIAGNOSIS — N400 Enlarged prostate without lower urinary tract symptoms: Secondary | ICD-10-CM | POA: Diagnosis not present

## 2024-02-20 DIAGNOSIS — E785 Hyperlipidemia, unspecified: Secondary | ICD-10-CM | POA: Diagnosis not present

## 2024-02-20 DIAGNOSIS — I10 Essential (primary) hypertension: Secondary | ICD-10-CM | POA: Diagnosis not present

## 2024-03-08 DIAGNOSIS — N401 Enlarged prostate with lower urinary tract symptoms: Secondary | ICD-10-CM | POA: Diagnosis not present

## 2024-03-08 DIAGNOSIS — E785 Hyperlipidemia, unspecified: Secondary | ICD-10-CM | POA: Diagnosis not present

## 2024-03-08 DIAGNOSIS — F329 Major depressive disorder, single episode, unspecified: Secondary | ICD-10-CM | POA: Diagnosis not present

## 2024-03-08 DIAGNOSIS — N183 Chronic kidney disease, stage 3 unspecified: Secondary | ICD-10-CM | POA: Diagnosis not present

## 2024-03-08 DIAGNOSIS — I10 Essential (primary) hypertension: Secondary | ICD-10-CM | POA: Diagnosis not present
